# Patient Record
Sex: Male | Born: 1935 | Race: White | Hispanic: No | Marital: Married
Health system: Southern US, Community
[De-identification: ages and names within clinical notes are randomized; demographics above are authoritative.]

## PROBLEM LIST (undated history)

## (undated) DIAGNOSIS — R42 Dizziness and giddiness: Secondary | ICD-10-CM

## (undated) DIAGNOSIS — M109 Gout, unspecified: Secondary | ICD-10-CM

## (undated) DIAGNOSIS — I959 Hypotension, unspecified: Secondary | ICD-10-CM

## (undated) DIAGNOSIS — F0390 Unspecified dementia without behavioral disturbance: Secondary | ICD-10-CM

## (undated) DIAGNOSIS — E079 Disorder of thyroid, unspecified: Secondary | ICD-10-CM

## (undated) DIAGNOSIS — G629 Polyneuropathy, unspecified: Secondary | ICD-10-CM

## (undated) DIAGNOSIS — I2510 Atherosclerotic heart disease of native coronary artery without angina pectoris: Secondary | ICD-10-CM

## (undated) DIAGNOSIS — E119 Type 2 diabetes mellitus without complications: Secondary | ICD-10-CM

## (undated) DIAGNOSIS — R0600 Dyspnea, unspecified: Secondary | ICD-10-CM

## (undated) DIAGNOSIS — K219 Gastro-esophageal reflux disease without esophagitis: Secondary | ICD-10-CM

## (undated) DIAGNOSIS — N400 Enlarged prostate without lower urinary tract symptoms: Secondary | ICD-10-CM

---

## 2019-03-26 ENCOUNTER — Emergency Department (HOSPITAL_COMMUNITY): Payer: Medicare Other

## 2019-03-26 ENCOUNTER — Other Ambulatory Visit: Payer: Self-pay

## 2019-03-26 ENCOUNTER — Emergency Department (HOSPITAL_COMMUNITY)
Admission: EM | Admit: 2019-03-26 | Discharge: 2019-03-26 | Disposition: A | Discharge status: Home / Self Care | Payer: Medicare Other | Attending: Emergency Medicine | Admitting: Emergency Medicine

## 2019-03-26 ENCOUNTER — Encounter (HOSPITAL_COMMUNITY): Payer: Self-pay | Admitting: Emergency Medicine

## 2019-03-26 DIAGNOSIS — Z79899 Other long term (current) drug therapy: Secondary | ICD-10-CM

## 2019-03-26 DIAGNOSIS — M545 Low back pain: Secondary | ICD-10-CM | POA: Insufficient documentation

## 2019-03-26 DIAGNOSIS — Z8546 Personal history of malignant neoplasm of prostate: Secondary | ICD-10-CM | POA: Insufficient documentation

## 2019-03-26 DIAGNOSIS — I10 Essential (primary) hypertension: Secondary | ICD-10-CM | POA: Diagnosis not present

## 2019-03-26 DIAGNOSIS — W010XXA Fall on same level from slipping, tripping and stumbling without subsequent striking against object, initial encounter: Secondary | ICD-10-CM | POA: Diagnosis not present

## 2019-03-26 DIAGNOSIS — F0391 Unspecified dementia with behavioral disturbance: Secondary | ICD-10-CM | POA: Diagnosis not present

## 2019-03-26 DIAGNOSIS — E039 Hypothyroidism, unspecified: Secondary | ICD-10-CM | POA: Diagnosis not present

## 2019-03-26 DIAGNOSIS — R296 Repeated falls: Secondary | ICD-10-CM

## 2019-03-26 DIAGNOSIS — Z043 Encounter for examination and observation following other accident: Secondary | ICD-10-CM | POA: Diagnosis present

## 2019-03-26 LAB — CBC WITH DIFFERENTIAL/PLATELET
Abs Immature Granulocytes: 0.03 10*3/uL (ref 0.00–0.07)
Basophils Absolute: 0 10*3/uL (ref 0.0–0.1)
Basophils Relative: 1 %
Eosinophils Absolute: 0.1 10*3/uL (ref 0.0–0.5)
Eosinophils Relative: 1 %
HCT: 47.5 % (ref 39.0–52.0)
Hemoglobin: 16 g/dL (ref 13.0–17.0)
Immature Granulocytes: 1 %
Lymphocytes Relative: 18 %
Lymphs Abs: 1.1 10*3/uL (ref 0.7–4.0)
MCH: 33.9 pg (ref 26.0–34.0)
MCHC: 33.7 g/dL (ref 30.0–36.0)
MCV: 100.6 fL — ABNORMAL HIGH (ref 80.0–100.0)
Monocytes Absolute: 0.6 10*3/uL (ref 0.1–1.0)
Monocytes Relative: 10 %
Neutro Abs: 4.6 10*3/uL (ref 1.7–7.7)
Neutrophils Relative %: 69 %
Platelets: 111 10*3/uL — ABNORMAL LOW (ref 150–400)
RBC: 4.72 MIL/uL (ref 4.22–5.81)
RDW: 13.6 % (ref 11.5–15.5)
WBC: 6.5 10*3/uL (ref 4.0–10.5)
nRBC: 0 % (ref 0.0–0.2)

## 2019-03-26 LAB — BASIC METABOLIC PANEL
Anion gap: 12 (ref 5–15)
BUN: 13 mg/dL (ref 8–23)
CO2: 24 mmol/L (ref 22–32)
Calcium: 9 mg/dL (ref 8.9–10.3)
Chloride: 104 mmol/L (ref 98–111)
Creatinine, Ser: 1.23 mg/dL (ref 0.61–1.24)
GFR calc Af Amer: >60 mL/min (ref >60)
GFR calc non Af Amer: 54 mL/min — ABNORMAL LOW (ref >60)
Glucose, Bld: 112 mg/dL — ABNORMAL HIGH (ref 70–99)
Potassium: 3.8 mmol/L (ref 3.5–5.1)
Sodium: 140 mmol/L (ref 135–145)

## 2019-03-26 MED ORDER — LORAZEPAM 1 MG PO TABS
2.0000 mg | ORAL_TABLET | Freq: Once | ORAL | Status: AC
  Administered 2019-03-26: 2 mg via ORAL
  Filled 2019-03-26: qty 2

## 2019-03-26 NOTE — ED Provider Notes (Signed)
[facility] EMERGENCY DEPARTMENT Provider Note   CSN: [id number] Arrival date & time: 03/26/19  [place]    History   Chief Complaint No chief complaint on file.   HPI Nazim Kadlec is a 85 y.o. male.     HPI  Patient presents via EMS from [facility] greens for evaluation of fall.  According to staff and EMS, he had unwitnessed fall.  Found down.  History of dementia and is unable to give history.  No obvious traumatic injuries.  On my exam he denies pain.  I called the facility and spoke with staff member Blessing who reported that he has been there for 2 days.  He has been refusing meals and that when he was found down he was complaining of low back pain and that EMS had to help him move due to significant discomfort but no other symptoms or concerns.  I also called his daughter Benjamine Mola who said that 2 weeks ago he had a fall down 2 steps and was evaluated at that time and told he had a back sprain.  Reports that he does seem to be more medicated in a subdued way since arrival at [facility].  History reviewed. No pertinent past medical history.  There are no active problems to display for this patient.   History reviewed. No pertinent surgical history.      Home Medications    Prior to Admission medications   Not on File    Family History No family history on file.  Social History Social History   Tobacco Use   Smoking status: Not on file  Substance Use Topics   Alcohol use: Not on file   Drug use: Not on file     Allergies   Patient has no allergy information on record.   Review of Systems Review of Systems  Unable to perform ROS: Dementia     Physical Exam Updated Vital Signs BP (!) 146/89 (BP Location: Right Arm)    Pulse 71    Resp 15    Ht 6\' 2"  (1.88 m)    SpO2 99%   Physical Exam Vitals signs and nursing note reviewed.  Constitutional:      General: He is not in acute distress.    Appearance: He is  well-developed.  HENT:     Head: Normocephalic and atraumatic.     Mouth/Throat:     Mouth: Mucous membranes are moist.  Eyes:     Conjunctiva/sclera: Conjunctivae normal.  Neck:     Musculoskeletal: Neck supple.  Cardiovascular:     Rate and Rhythm: Normal rate. Rhythm irregular.     Heart sounds: No murmur.     Comments: Distant heart sounds Pulmonary:     Effort: Pulmonary effort is normal. No respiratory distress.     Breath sounds: Normal breath sounds.  Abdominal:     Palpations: Abdomen is soft.     Tenderness: There is no abdominal tenderness.  Musculoskeletal:        General: No tenderness.  Skin:    General: Skin is warm and dry.  Neurological:     Mental Status: He is alert.     Comments: Pleasant demented.  Oriented to person and place but not time.  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  Easily redirectable.    Psychiatric:     Comments: Calm, cooperative      ED Treatments / Results  Labs (all labs ordered are listed, but  only abnormal results are displayed) Labs Reviewed  CBC WITH DIFFERENTIAL/PLATELET - Abnormal; Notable for the following components:      Result Value   MCV 100.6 (*)    Platelets 111 (*)    All other components within normal limits  BASIC METABOLIC PANEL - Abnormal; Notable for the following components:   Glucose, Bld 112 (*)    GFR calc non Af Amer 54 (*)    All other components within normal limits    EKG None  Radiology Ct Head Wo Contrast  Result Date: 03/26/2019 CLINICAL DATA:  Pt had an unwitnessed fall at an unknown time. Pt denied LOC and neck pain. Pt complains of right lower back pain. Pt also had a fall 2 weeks ago. Pt does have dementia. EXAM: CT HEAD WITHOUT CONTRAST CT CERVICAL SPINE WITHOUT CONTRAST TECHNIQUE: Multidetector CT imaging of the head and cervical spine was performed following the standard protocol without intravenous contrast. Multiplanar CT image reconstructions of the cervical spine  were also generated. COMPARISON:  None. FINDINGS: CT HEAD FINDINGS Brain: No evidence of acute infarction, hemorrhage, hydrocephalus, extra-axial collection or mass lesion/mass effect. There is ventricular and sulcal enlargement reflecting moderate generalized atrophy. 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 There is also an old infarct of the left temporal lobe patchy white matter hypoattenuation is seen more diffusely consistent with moderate chronic microvascular ischemic change. Vascular: No hyperdense vessel or unexpected calcification. Skull: Normal. Negative for fracture or focal lesion. Sinuses/Orbits: Globes and orbits are unremarkable. Sinuses and mastoid air cells are clear. Other: None. CT CERVICAL SPINE FINDINGS Alignment: Neck is held hyper extended. There is no spondylolisthesis. Skull base and vertebrae: No acute fracture. No primary bone lesion or focal pathologic process. Soft tissues and spinal canal: No prevertebral fluid or swelling. No visible canal hematoma. Disc levels: Mild loss of disc height at C4-C5 with moderate loss of disc height at C5-C6 and C6-C7. There are facet degenerative changes bilaterally, greater on the right most severe at C3-C4 and C4-C5. No convincing disc herniation. Upper chest: No acute findings.  Lung apices are clear. Other: None. IMPRESSION: HEAD CT 1. No acute intracranial abnormalities. 2. Atrophy, old infarcts and chronic microvascular ischemic change as detailed. CERVICAL CT 1. No fracture or acute finding. Electronically Signed   By: Amie Portlandavid  Ormond M.D.   On: 03/26/2019 19:48   Ct Cervical Spine Wo Contrast  Result Date: 03/26/2019 CLINICAL DATA:  Pt had an unwitnessed fall at an unknown time. Pt denied LOC and neck pain. Pt complains of right lower back pain. Pt also had a fall 2 weeks ago. Pt does have dementia. EXAM: CT HEAD WITHOUT CONTRAST CT CERVICAL SPINE WITHOUT CONTRAST TECHNIQUE:  Multidetector CT imaging of the head and cervical spine was performed following the standard protocol without intravenous contrast. Multiplanar CT image reconstructions of the cervical spine were also generated. COMPARISON:  None. FINDINGS: CT HEAD FINDINGS Brain: No evidence of acute infarction, hemorrhage, hydrocephalus, extra-axial collection or mass lesion/mass effect. There is ventricular and sulcal enlargement reflecting moderate generalized atrophy. 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 There is also an old infarct of the left temporal lobe patchy white matter hypoattenuation is seen more diffusely consistent with moderate chronic microvascular ischemic change. Vascular: No hyperdense vessel or unexpected calcification. Skull: Normal. Negative for fracture or focal lesion. Sinuses/Orbits: Globes and orbits are unremarkable. Sinuses and mastoid air cells  are clear. Other: None. CT CERVICAL SPINE FINDINGS Alignment: Neck is held hyper extended. There is no spondylolisthesis. Skull base and vertebrae: No acute fracture. No primary bone lesion or focal pathologic process. Soft tissues and spinal canal: No prevertebral fluid or swelling. No visible canal hematoma. Disc levels: Mild loss of disc height at C4-C5 with moderate loss of disc height at C5-C6 and C6-C7. There are facet degenerative changes bilaterally, greater on the right most severe at C3-C4 and C4-C5. No convincing disc herniation. Upper chest: No acute findings.  Lung apices are clear. Other: None. IMPRESSION: HEAD CT 1. No acute intracranial abnormalities. 2. Atrophy, old infarcts and chronic microvascular ischemic change as detailed. CERVICAL CT 1. No fracture or acute finding. Electronically Signed   By: Amie Portlandavid  Ormond M.D.   On: 03/26/2019 19:48    Procedures Procedures (including critical care time)  Medications Ordered in ED Medications  LORazepam (ATIVAN) tablet 2 mg (2 mg  Oral Given 03/26/19 2020)     Initial Impression / Assessment and Plan / ED Course  I have reviewed the triage vital signs and the nursing notes.  Pertinent labs & imaging results that were available during my care of the patient were reviewed by me and considered in my medical decision making (see chart for details).        Mr. Kimberlee Nearingtheridge is an 85 year old gentleman with a history of dizziness, hypotension, hypertension, dyspnea, hyperglycemia, falls, hypothyroidism, GERD, gout, hyperlipidemia, abnormal gait, neuropathy, malignant neoplasm of prostate, dimension, and cardiovascular disorder.  Current medications include allopurinol, amlodipine, divalproex, lorazepam, memantine, quetiapine, sertraline.  We do not have records in our system.  This history is from review of facility records.  Presented today for unwitnessed fall.  He had no tenderness.  Per facility he is at baseline mentally.  Due to advanced age and unknown severity of magnesium proceed with CT head and cervical spine.  Patient's exam was reassuring but give history of complaint of pain by staff I also ordered hip and back xray.  However after returning from CT I was able to clear his collar as he had full range of motion and no midline tenderness and he was ambulating without any tenderness at all in his back or hips.  Discontinued plain films.  He became more anxious and difficult to redirect while awaiting CT results.  Gave 2 mg Ativan p.o. which is a normal evening medication.  He had good response and was appropriate for discharge.  Arranged for transfer back to facility and he was discharged in stable condition to follow-up as needed with PCP.  I did note on his discharge impressions diagnosis of high risk medication use due to regular lorazepam in geriatric patient which puts him at increased risk of falls and I would recommend he be weaned from this and placed on alternative if at all possible.  This patient was seen in  conjunction with Dr. Patria Maneampos.       Final Clinical Impressions(s) / ED Diagnoses   Final diagnoses:  Fall in elderly patient  Dementia with behavioral disturbance, unspecified dementia type (HCC)  High risk medication use    ED Discharge Orders    None       Jaclynn MajorStarnes, Jeanifer Halliday, MD 03/26/19 2051    Azalia Bilisampos, Kevin, MD 03/26/19 2139

## 2019-03-26 NOTE — ED Notes (Signed)
Patient back from CT.

## 2019-03-26 NOTE — ED Notes (Signed)
Patient transported to CT 

## 2019-03-26 NOTE — ED Notes (Signed)
Patient verbalizes understanding of discharge instructions. Opportunity for questioning and answers were provided. Armband removed by staff, pt discharged from ED via [facility] to home.  °

## 2019-03-26 NOTE — ED Triage Notes (Signed)
[facility]- pt is brought in from [facility]. Pt had an unwitnessed fall at an unknown time. Pt denied LOC and neck pain. Pt complains of right lower back pain. Pt also had a fall 2 weeks ago. Pt does have dementia.   118/54 79 HR 107 CBG

## 2019-03-26 NOTE — ED Notes (Signed)
Called ptar 

## 2019-04-10 ENCOUNTER — Emergency Department (HOSPITAL_COMMUNITY): Payer: Medicare Other

## 2019-04-10 ENCOUNTER — Encounter (HOSPITAL_COMMUNITY): Payer: Self-pay | Admitting: *Deleted

## 2019-04-10 ENCOUNTER — Emergency Department (HOSPITAL_COMMUNITY)
Admission: EM | Admit: 2019-04-10 | Discharge: 2019-04-10 | Disposition: A | Discharge status: Home / Self Care | Payer: Medicare Other | Attending: Emergency Medicine | Admitting: Emergency Medicine

## 2019-04-10 ENCOUNTER — Other Ambulatory Visit: Payer: Self-pay

## 2019-04-10 DIAGNOSIS — E119 Type 2 diabetes mellitus without complications: Secondary | ICD-10-CM | POA: Insufficient documentation

## 2019-04-10 DIAGNOSIS — Y92129 Unspecified place in nursing home as the place of occurrence of the external cause: Secondary | ICD-10-CM | POA: Insufficient documentation

## 2019-04-10 DIAGNOSIS — Y939 Activity, unspecified: Secondary | ICD-10-CM | POA: Insufficient documentation

## 2019-04-10 DIAGNOSIS — S0003XA Contusion of scalp, initial encounter: Secondary | ICD-10-CM | POA: Diagnosis not present

## 2019-04-10 DIAGNOSIS — S0181XA Laceration without foreign body of other part of head, initial encounter: Secondary | ICD-10-CM | POA: Diagnosis not present

## 2019-04-10 DIAGNOSIS — I259 Chronic ischemic heart disease, unspecified: Secondary | ICD-10-CM | POA: Diagnosis not present

## 2019-04-10 DIAGNOSIS — Y999 Unspecified external cause status: Secondary | ICD-10-CM | POA: Insufficient documentation

## 2019-04-10 DIAGNOSIS — S098XXA Other specified injuries of head, initial encounter: Secondary | ICD-10-CM | POA: Diagnosis not present

## 2019-04-10 DIAGNOSIS — Z23 Encounter for immunization: Secondary | ICD-10-CM | POA: Diagnosis not present

## 2019-04-10 DIAGNOSIS — Z79899 Other long term (current) drug therapy: Secondary | ICD-10-CM | POA: Diagnosis not present

## 2019-04-10 DIAGNOSIS — F0390 Unspecified dementia without behavioral disturbance: Secondary | ICD-10-CM | POA: Diagnosis not present

## 2019-04-10 DIAGNOSIS — W19XXXA Unspecified fall, initial encounter: Secondary | ICD-10-CM | POA: Insufficient documentation

## 2019-04-10 DIAGNOSIS — S0990XA Unspecified injury of head, initial encounter: Secondary | ICD-10-CM | POA: Diagnosis present

## 2019-04-10 HISTORY — DX: Benign prostatic hyperplasia without lower urinary tract symptoms: N40.0

## 2019-04-10 HISTORY — DX: Dizziness and giddiness: R42

## 2019-04-10 HISTORY — DX: Dyspnea, unspecified: R06.00

## 2019-04-10 HISTORY — DX: Gout, unspecified: M10.9

## 2019-04-10 HISTORY — DX: Unspecified dementia, unspecified severity, without behavioral disturbance, psychotic disturbance, mood disturbance, and anxiety: F03.90

## 2019-04-10 HISTORY — DX: Disorder of thyroid, unspecified: E07.9

## 2019-04-10 HISTORY — DX: Atherosclerotic heart disease of native coronary artery without angina pectoris: I25.10

## 2019-04-10 HISTORY — DX: Type 2 diabetes mellitus without complications: E11.9

## 2019-04-10 HISTORY — DX: Hypotension, unspecified: I95.9

## 2019-04-10 HISTORY — DX: Polyneuropathy, unspecified: G62.9

## 2019-04-10 HISTORY — DX: Gastro-esophageal reflux disease without esophagitis: K21.9

## 2019-04-10 LAB — CBC WITH DIFFERENTIAL/PLATELET
Abs Immature Granulocytes: 0.05 10*3/uL (ref 0.00–0.07)
Basophils Absolute: 0 10*3/uL (ref 0.0–0.1)
Basophils Relative: 1 %
Eosinophils Absolute: 0.1 10*3/uL (ref 0.0–0.5)
Eosinophils Relative: 1 %
HCT: 43.6 % (ref 39.0–52.0)
Hemoglobin: 14.3 g/dL (ref 13.0–17.0)
Immature Granulocytes: 1 %
Lymphocytes Relative: 10 %
Lymphs Abs: 0.8 10*3/uL (ref 0.7–4.0)
MCH: 33.6 pg (ref 26.0–34.0)
MCHC: 32.8 g/dL (ref 30.0–36.0)
MCV: 102.6 fL — ABNORMAL HIGH (ref 80.0–100.0)
Monocytes Absolute: 0.8 10*3/uL (ref 0.1–1.0)
Monocytes Relative: 10 %
Neutro Abs: 6.2 10*3/uL (ref 1.7–7.7)
Neutrophils Relative %: 77 %
Platelets: 123 10*3/uL — ABNORMAL LOW (ref 150–400)
RBC: 4.25 MIL/uL (ref 4.22–5.81)
RDW: 13.4 % (ref 11.5–15.5)
WBC: 7.9 10*3/uL (ref 4.0–10.5)
nRBC: 0 % (ref 0.0–0.2)

## 2019-04-10 LAB — COMPREHENSIVE METABOLIC PANEL
ALT: 18 U/L (ref 0–44)
AST: 24 U/L (ref 15–41)
Albumin: 3.4 g/dL — ABNORMAL LOW (ref 3.5–5.0)
Alkaline Phosphatase: 145 U/L — ABNORMAL HIGH (ref 38–126)
Anion gap: 12 (ref 5–15)
BUN: 14 mg/dL (ref 8–23)
CO2: 26 mmol/L (ref 22–32)
Calcium: 8.8 mg/dL — ABNORMAL LOW (ref 8.9–10.3)
Chloride: 101 mmol/L (ref 98–111)
Creatinine, Ser: 1.08 mg/dL (ref 0.61–1.24)
GFR calc Af Amer: >60 mL/min (ref >60)
GFR calc non Af Amer: >60 mL/min (ref >60)
Glucose, Bld: 133 mg/dL — ABNORMAL HIGH (ref 70–99)
Potassium: 5.1 mmol/L (ref 3.5–5.1)
Sodium: 139 mmol/L (ref 135–145)
Total Bilirubin: 0.8 mg/dL (ref 0.3–1.2)
Total Protein: 6.1 g/dL — ABNORMAL LOW (ref 6.5–8.1)

## 2019-04-10 MED ORDER — TETANUS-DIPHTH-ACELL PERTUSSIS 5-2.5-18.5 LF-MCG/0.5 IM SUSP
0.5000 mL | Freq: Once | INTRAMUSCULAR | Status: AC
  Administered 2019-04-10: 0.5 mL via INTRAMUSCULAR
  Filled 2019-04-10: qty 0.5

## 2019-04-10 NOTE — ED Notes (Addendum)
Condom cath placed on pt 

## 2019-04-10 NOTE — ED Triage Notes (Addendum)
EMS reports pt resides at [facility] fall with laceration to left head near eye, bleeding controlled, no other injuries noted. Pt is an alert pt with dementia. 138/84-86-18-98% RA 97.0 Cbg 121 Pt usually walks with walker but was not today. Pt was placed in c collar by EMS.

## 2019-04-10 NOTE — ED Notes (Signed)
Puncture and hematoma over left eye cleaned and dressing applied. Ice also applied

## 2019-04-10 NOTE — ED Notes (Addendum)
Wound cleaned with normal saline, and dried, bleeding controlled, superficial lacerations to left eyebrow noted, no deep lacerations. Large hematoma to left eyebrow and forehead area, brusing noted on right hip area. Patient complaining of lower back pain.

## 2019-04-10 NOTE — ED Provider Notes (Signed)
[facility] DEPT Provider Note   CSN: [id number] Arrival date & time: 04/10/19  1833    History   Chief Complaint Chief Complaint  Patient presents with   Fall   Laceration    HPI Kevin [name] is a 85 y.o. male.     HPI Patient arrives from nursing home after unwitnessed fall.  Sustained laceration to the left eyebrow.  Patient has history of dementia.  He is unable to contribute to history.  Level 5 caveat applies. Past Medical History:  Diagnosis Date   BPH (benign prostatic hyperplasia)    Coronary artery disease    Dementia (HCC)    Diabetes mellitus without complication (HCC)    Dizziness    Dyspnea    GERD (gastroesophageal reflux disease)    Gout    Hypotension    Neuropathy    Thyroid disease     There are no active problems to display for this patient.   History reviewed. No pertinent surgical history.      Home Medications    Prior to Admission medications   Medication Sig Start Date End Date Taking? Authorizing Provider  acetaminophen (TYLENOL) 500 MG tablet Take 500 mg by mouth every 4 (four) hours as needed for moderate pain.   Yes [provider]  allopurinol (ZYLOPRIM) 100 MG tablet Take 100 mg by mouth daily. 01/25/19  Yes [provider]  amLODipine (NORVASC) 2.5 MG tablet Take 2.5 mg by mouth daily. 03/25/19  Yes [provider]  divalproex (DEPAKOTE ER) 250 MG 24 hr tablet Take 750 mg by mouth at bedtime. 03/20/19  Yes [provider]  LORazepam (ATIVAN) 0.5 MG tablet Take 0.5 mg by mouth 4 (four) times daily. 03/29/19  Yes [provider]  magnesium oxide-pyridoxine (BEELITH) 362-20 MG TABS tablet Take 1 tablet by mouth daily.   Yes [provider]  memantine (NAMENDA) 10 MG tablet Take 10 mg by mouth 2 (two) times daily. 03/25/19  Yes [provider]  QUEtiapine (SEROQUEL) 50 MG tablet Take 50 mg by mouth at bedtime. 03/30/19  Yes  [provider]  sertraline (ZOLOFT) 25 MG tablet Take 25 mg by mouth daily. 03/10/19  Yes [provider]  vitamin B-12 (CYANOCOBALAMIN) 1000 MCG tablet Take 1,000 mcg by mouth daily.   Yes [provider]    Family History No family history on file.  Social History Social History   Tobacco Use   Smoking status: Unknown If Ever Smoked   Smokeless tobacco: Never Used  Substance Use Topics   Alcohol use: Not Currently   Drug use: Not Currently     Allergies   Patient has no known allergies.   Review of Systems Review of Systems  Unable to perform ROS: Dementia     Physical Exam Updated Vital Signs BP (!) 152/78    Pulse 75    Temp 97.8 F (36.6 C) (Oral)    Resp 18    SpO2 95%   Physical Exam Vitals signs and nursing note reviewed.  Constitutional:      Appearance: Normal appearance. He is well-developed.  HENT:     Head: Normocephalic.     Comments: Left eyebrow hematoma.  2 small laceration just superior to the lateral of the left eyebrow.  Appear to be mostly superficial.  No active bleeding.    Mouth/Throat:     Mouth: Mucous membranes are moist.  Eyes:     Pupils: Pupils are equal, round, and reactive  to light.  Neck:     Comments: Cervical collar in place. Cardiovascular:     Rate and Rhythm: Normal rate and regular rhythm.  Pulmonary:     Effort: Pulmonary effort is normal.     Breath sounds: Normal breath sounds.  Abdominal:     General: Bowel sounds are normal.     Palpations: Abdomen is soft.     Tenderness: There is no abdominal tenderness. There is no guarding or rebound.  Musculoskeletal: Normal range of motion.        General: No swelling, tenderness, deformity or signs of injury.     Right lower leg: No edema.     Left lower leg: No edema.     Comments: Pelvis is stable.  Skin:    General: Skin is warm and dry.     Findings: No erythema or rash.  Neurological:     Mental Status: He is alert.     Comments:  Oriented to self.  Intermittently following commands.  Psychiatric:        Behavior: Behavior normal.      ED Treatments / Results  Labs (all labs ordered are listed, but only abnormal results are displayed) Labs Reviewed  CBC WITH DIFFERENTIAL/PLATELET - Abnormal; Notable for the following components:      Result Value   MCV 102.6 (*)    Platelets 123 (*)    All other components within normal limits  COMPREHENSIVE METABOLIC PANEL - Abnormal; Notable for the following components:   Glucose, Bld 133 (*)    Calcium 8.8 (*)    Total Protein 6.1 (*)    Albumin 3.4 (*)    Alkaline Phosphatase 145 (*)    All other components within normal limits    EKG None  Radiology Ct Head Wo Contrast  Result Date: 04/10/2019 CLINICAL DATA:  Left frontal scalp laceration following a fall today. EXAM: CT HEAD WITHOUT CONTRAST CT CERVICAL SPINE WITHOUT CONTRAST TECHNIQUE: Multidetector CT imaging of the head and cervical spine was performed following the standard protocol without intravenous contrast. Multiplanar CT image reconstructions of the cervical spine were also generated. COMPARISON:  03/26/2019. FINDINGS: CT HEAD FINDINGS Brain: Stable moderately enlarged ventricles and subarachnoid spaces. Stable moderate patchy white matter low density in both cerebral hemispheres. Stable old right frontal, right parietal, left temporal and right occipital infarcts. No intracranial hemorrhage, mass lesion or CT evidence of acute infarction. Vascular: No hyperdense vessel or unexpected calcification. Skull: Normal. Negative for fracture or focal lesion. Sinuses/Orbits: Unremarkable. Other: Large left frontal scalp hematoma. CT CERVICAL SPINE FINDINGS Alignment: No significant change in mild anterolisthesis at the C3-4 level and mild retrolisthesis at the C5-6 level. Skull base and vertebrae: No acute fracture. No primary bone lesion or focal pathologic process. Soft tissues and spinal canal: No prevertebral fluid  or swelling. No visible canal hematoma. Disc levels:  Stable multilevel degenerative changes. Upper chest: Stable biapical pleural and parenchymal scarring. Other: Minimal left carotid artery calcification. IMPRESSION: 1. Large left frontal scalp hematoma without skull fracture or intracranial hemorrhage. 2. Stable atrophy, chronic small vessel white matter ischemic changes and old infarcts, as described above. 3. No cervical spine fracture or traumatic subluxation. 4. Stable multilevel cervical spine degenerative changes. 5. Minimal left carotid artery atheromatous calcification. Electronically Signed   By: Beckie SaltsSteven  Reid M.D.   On: 04/10/2019 19:42   Ct Cervical Spine Wo Contrast  Result Date: 04/10/2019 CLINICAL DATA:  Left frontal scalp laceration following a fall today. EXAM: CT HEAD  WITHOUT CONTRAST CT CERVICAL SPINE WITHOUT CONTRAST TECHNIQUE: Multidetector CT imaging of the head and cervical spine was performed following the standard protocol without intravenous contrast. Multiplanar CT image reconstructions of the cervical spine were also generated. COMPARISON:  03/26/2019. FINDINGS: CT HEAD FINDINGS Brain: Stable moderately enlarged ventricles and subarachnoid spaces. Stable moderate patchy white matter low density in both cerebral hemispheres. Stable old right frontal, right parietal, left temporal and right occipital infarcts. No intracranial hemorrhage, mass lesion or CT evidence of acute infarction. Vascular: No hyperdense vessel or unexpected calcification. Skull: Normal. Negative for fracture or focal lesion. Sinuses/Orbits: Unremarkable. Other: Large left frontal scalp hematoma. CT CERVICAL SPINE FINDINGS Alignment: No significant change in mild anterolisthesis at the C3-4 level and mild retrolisthesis at the C5-6 level. Skull base and vertebrae: No acute fracture. No primary bone lesion or focal pathologic process. Soft tissues and spinal canal: No prevertebral fluid or swelling. No visible canal  hematoma. Disc levels:  Stable multilevel degenerative changes. Upper chest: Stable biapical pleural and parenchymal scarring. Other: Minimal left carotid artery calcification. IMPRESSION: 1. Large left frontal scalp hematoma without skull fracture or intracranial hemorrhage. 2. Stable atrophy, chronic small vessel white matter ischemic changes and old infarcts, as described above. 3. No cervical spine fracture or traumatic subluxation. 4. Stable multilevel cervical spine degenerative changes. 5. Minimal left carotid artery atheromatous calcification. Electronically Signed   By: Beckie SaltsSteven  Reid M.D.   On: 04/10/2019 19:42    Procedures Procedures (including critical care time)  Medications Ordered in ED Medications  Tdap (BOOSTRIX) injection 0.5 mL (0.5 mLs Intramuscular Given 04/10/19 1941)     Initial Impression / Assessment and Plan / ED Course  I have reviewed the triage vital signs and the nursing notes.  Pertinent labs & imaging results that were available during my care of the patient were reviewed by me and considered in my medical decision making (see chart for details).        Lacerations appeared to be superficial and did not need closure.  Will put antibiotic ointment and place dressing.  CT head and cervical spine without acute findings other than left eyebrow hematoma.  Ice is placed on the hematoma.  Final Clinical Impressions(s) / ED Diagnoses   Final diagnoses:  Injury of head, initial encounter  Scalp hematoma, initial encounter  Facial laceration, initial encounter    ED Discharge Orders    None       Loren RacerYelverton, Rumeal Cullipher, MD 04/10/19 2109

## 2019-04-10 NOTE — ED Notes (Signed)
Antibiotic ointment applied to superficial lacerations as well as non-stick dressing, ice pack applied to hematoma per MD.

## 2019-04-10 NOTE — ED Notes (Signed)
Wife contact-please update when labs and CT scans result: Home [phone] Cell [phone]

## 2019-04-10 NOTE — ED Notes (Signed)
Called to update wife and daughter on pt status, informed them he was being transferred back to [facility]. [facility] in route and will transport to [facility]. Chastity at [facility] in [place] took report on pt.

## 2019-04-15 ENCOUNTER — Other Ambulatory Visit: Payer: Self-pay

## 2019-04-15 ENCOUNTER — Emergency Department (HOSPITAL_COMMUNITY): Payer: Medicare Other

## 2019-04-15 ENCOUNTER — Emergency Department (HOSPITAL_COMMUNITY)
Admission: EM | Admit: 2019-04-15 | Discharge: 2019-04-15 | Disposition: A | Discharge status: Home / Self Care | Payer: Medicare Other | Attending: Emergency Medicine | Admitting: Emergency Medicine

## 2019-04-15 DIAGNOSIS — I499 Cardiac arrhythmia, unspecified: Secondary | ICD-10-CM

## 2019-04-15 DIAGNOSIS — E119 Type 2 diabetes mellitus without complications: Secondary | ICD-10-CM | POA: Insufficient documentation

## 2019-04-15 DIAGNOSIS — R002 Palpitations: Secondary | ICD-10-CM | POA: Diagnosis present

## 2019-04-15 DIAGNOSIS — Z79899 Other long term (current) drug therapy: Secondary | ICD-10-CM | POA: Diagnosis not present

## 2019-04-15 DIAGNOSIS — F0390 Unspecified dementia without behavioral disturbance: Secondary | ICD-10-CM | POA: Insufficient documentation

## 2019-04-15 DIAGNOSIS — I2510 Atherosclerotic heart disease of native coronary artery without angina pectoris: Secondary | ICD-10-CM | POA: Diagnosis not present

## 2019-04-15 LAB — CBC WITH DIFFERENTIAL/PLATELET
Abs Immature Granulocytes: 0.03 10*3/uL (ref 0.00–0.07)
Basophils Absolute: 0 10*3/uL (ref 0.0–0.1)
Basophils Relative: 0 %
Eosinophils Absolute: 0.1 10*3/uL (ref 0.0–0.5)
Eosinophils Relative: 2 %
HCT: 42.6 % (ref 39.0–52.0)
Hemoglobin: 14.1 g/dL (ref 13.0–17.0)
Immature Granulocytes: 1 %
Lymphocytes Relative: 16 %
Lymphs Abs: 1 10*3/uL (ref 0.7–4.0)
MCH: 34 pg (ref 26.0–34.0)
MCHC: 33.1 g/dL (ref 30.0–36.0)
MCV: 102.7 fL — ABNORMAL HIGH (ref 80.0–100.0)
Monocytes Absolute: 0.5 10*3/uL (ref 0.1–1.0)
Monocytes Relative: 9 %
Neutro Abs: 4.4 10*3/uL (ref 1.7–7.7)
Neutrophils Relative %: 72 %
Platelets: 129 10*3/uL — ABNORMAL LOW (ref 150–400)
RBC: 4.15 MIL/uL — ABNORMAL LOW (ref 4.22–5.81)
RDW: 13.2 % (ref 11.5–15.5)
WBC: 6.1 10*3/uL (ref 4.0–10.5)
nRBC: 0 % (ref 0.0–0.2)

## 2019-04-15 LAB — TSH: TSH: 1.653 u[IU]/mL (ref 0.350–4.500)

## 2019-04-15 LAB — MAGNESIUM: Magnesium: 2.2 mg/dL (ref 1.7–2.4)

## 2019-04-15 LAB — BASIC METABOLIC PANEL
Anion gap: 10 (ref 5–15)
BUN: 12 mg/dL (ref 8–23)
CO2: 27 mmol/L (ref 22–32)
Calcium: 8.4 mg/dL — ABNORMAL LOW (ref 8.9–10.3)
Chloride: 104 mmol/L (ref 98–111)
Creatinine, Ser: 1.11 mg/dL (ref 0.61–1.24)
GFR calc Af Amer: >60 mL/min (ref >60)
GFR calc non Af Amer: >60 mL/min (ref >60)
Glucose, Bld: 128 mg/dL — ABNORMAL HIGH (ref 70–99)
Potassium: 3.9 mmol/L (ref 3.5–5.1)
Sodium: 141 mmol/L (ref 135–145)

## 2019-04-15 NOTE — ED Notes (Signed)
Patient verbalizes understanding of discharge instructions. Opportunity for questioning and answers were provided. Armband removed by staff, pt discharged from ED with [facility].  

## 2019-04-15 NOTE — ED Provider Notes (Signed)
[facility] Provider Note   CSN: [id number] Arrival date & time: 04/15/19  1415     History   Chief Complaint Chief Complaint  Patient presents with  . Palpitations    HPI Kevin [name] is a 85 y.o. male with past medical history significant for BPH, CAD, dementia, diabetes, hypotension, neuropathy presents to emergency department today via EMS from [facility] after being found to have irregular heart beat. EMS reports pt was being evaluated on rounds today and the MD checked his pulse and found it to be irregular. Pt denies any pain, shortness of breath however he is demented so a level 5 caveat applies.    Past Medical History:  Diagnosis Date  . BPH (benign prostatic hyperplasia)   . Coronary artery disease   . Dementia (HCC)   . Diabetes mellitus without complication (HCC)   . Dizziness   . Dyspnea   . GERD (gastroesophageal reflux disease)   . Gout   . Hypotension   . Neuropathy   . Thyroid disease     There are no active problems to display for this patient.   No past surgical history on file.      Home Medications    Prior to Admission medications   Medication Sig Start Date End Date Taking? Authorizing Provider  acetaminophen (TYLENOL) 500 MG tablet Take 500 mg by mouth every 4 (four) hours as needed for moderate pain.    [provider]  allopurinol (ZYLOPRIM) 100 MG tablet Take 100 mg by mouth daily. 01/25/19   [provider]  amLODipine (NORVASC) 2.5 MG tablet Take 2.5 mg by mouth daily. 03/25/19   [provider]  divalproex (DEPAKOTE ER) 250 MG 24 hr tablet Take 750 mg by mouth at bedtime. 03/20/19   [provider]  LORazepam (ATIVAN) 0.5 MG tablet Take 0.5 mg by mouth 4 (four) times daily. 03/29/19   [provider]  magnesium oxide-pyridoxine (BEELITH) 362-20 MG TABS tablet Take 1 tablet by mouth daily.    [provider]  memantine (NAMENDA) 10 MG  tablet Take 10 mg by mouth 2 (two) times daily. 03/25/19   [provider]  QUEtiapine (SEROQUEL) 50 MG tablet Take 50 mg by mouth at bedtime. 03/30/19   [provider]  sertraline (ZOLOFT) 25 MG tablet Take 25 mg by mouth daily. 03/10/19   [provider]  vitamin B-12 (CYANOCOBALAMIN) 1000 MCG tablet Take 1,000 mcg by mouth daily.    [provider]    Family History No family history on file.  Social History Social History   Tobacco Use  . Smoking status: Unknown If Ever Smoked  . Smokeless tobacco: Never Used  Substance Use Topics  . Alcohol use: Not Currently  . Drug use: Not Currently     Allergies   Patient has no known allergies.   Review of Systems Review of Systems  Unable to perform ROS: Dementia     Physical Exam Updated Vital Signs BP 137/66 (BP Location: Right Arm)   Pulse 80   Temp 97.7 F (36.5 C) (Oral)   Resp 16   SpO2 98%   Physical Exam Vitals signs and nursing note reviewed.  Constitutional:      General: He is not in acute distress.    Appearance: He is not ill-appearing.     Comments: Pt has healing hematoma to left side of face.   HENT:     Head: Normocephalic and atraumatic.  Right Ear: Tympanic membrane and external ear normal.     Left Ear: Tympanic membrane and external ear normal.     Nose: Nose normal.     Mouth/Throat:     Mouth: Mucous membranes are moist.     Pharynx: Oropharynx is clear.  Eyes:     General: No scleral icterus.       Right eye: No discharge.        Left eye: No discharge.     Extraocular Movements: Extraocular movements intact.     Conjunctiva/sclera: Conjunctivae normal.     Pupils: Pupils are equal, round, and reactive to light.  Neck:     Musculoskeletal: Normal range of motion.     Vascular: No JVD.  Cardiovascular:     Rate and Rhythm: Normal rate and regular rhythm.     Pulses: Normal pulses.          Radial pulses are 2+ on the right side and 2+ on the left  side.     Heart sounds: Normal heart sounds.  Pulmonary:     Comments: Lungs clear to auscultation in all fields. Symmetric chest rise. No wheezing, rales, or rhonchi. Chest:     Chest wall: No tenderness.  Abdominal:     Comments: Abdomen is soft, non-distended, and non-tender in all quadrants. No rigidity, no guarding. No peritoneal signs.  Musculoskeletal: Normal range of motion.  Skin:    General: Skin is warm and dry.     Capillary Refill: Capillary refill takes less than 2 seconds.  Neurological:     Mental Status: He is oriented to person, place, and time.     GCS: GCS eye subscore is 4. GCS verbal subscore is 5. GCS motor subscore is 6.     Comments: Fluent speech, no facial droop. Pt is pleasantly demented. He is alert and oriented to self only  Psychiatric:        Behavior: Behavior normal.      ED Treatments / Results  Labs (all labs ordered are listed, but only abnormal results are displayed) Labs Reviewed  CBC WITH DIFFERENTIAL/PLATELET - Abnormal; Notable for the following components:      Result Value   RBC 4.15 (*)    MCV 102.7 (*)    Platelets 129 (*)    All other components within normal limits  BASIC METABOLIC PANEL - Abnormal; Notable for the following components:   Glucose, Bld 128 (*)    Calcium 8.4 (*)    All other components within normal limits  MAGNESIUM  TSH    EKG EKG Interpretation  Date/Time:  Thursday April 15 2019 14:47:11 EDT Ventricular Rate:  89 PR Interval:    QRS Duration: 95 QT Interval:  408 QTC Calculation: 471 R Axis:   7 Text Interpretation:  Sinus rhythm Multiform ventricular premature complexes Prolonged PR interval Low voltage, extremity and precordial leads Consider anterior infarct Confirmed by Blane OharaZavitz, Joshua [phone]([id number]) on 04/15/2019 3:04:03 PM   Radiology No results found.  Procedures Procedures (including critical care time)  Medications Ordered in ED Medications - No data to display   Initial Impression /  Assessment and Plan / ED Course  I have reviewed the triage vital signs and the nursing notes.  Pertinent labs & imaging results that were available during my care of the patient were reviewed by me and considered in my medical decision making (see chart for details).  85 yo male presents after being found to have irregular hear rate. He  is pleasantly demented. He does not appear to be in respiratory distress. Pt palpated from head to toe without any tenderness. Chest wall is non tender to palpation. Lungs are clear to auscultation in all fields.  He is overall very well appearing. EKG without ischemic changes.    CBC, BMP, and magnesium overall unremarkable. TSH and chest xray pending. Findings and plan of care discussed with supervising physician Dr. Reather Converse who agrees with plan.   Patient care transferred to J. Ward PA-C at the end of my shift. Patient presentation, ED course, and plan of care discussed with review of all pertinent labs and imaging. Please see hernote for further details regarding further ED course and disposition. Anticipate discharge to facility with TSH and chest xray are without findings.  This note was prepared using Dragon voice recognition software and may include unintentional dictation errors due to the inherent limitations of voice recognition software.    Final Clinical Impressions(s) / ED Diagnoses   Final diagnoses:  None    ED Discharge Orders    None       Cherre Robins, PA-C 04/15/19 1604    Elnora Morrison, MD 04/15/19 1624

## 2019-04-15 NOTE — Discharge Instructions (Signed)
It was my pleasure taking care of you today!   Fortunately, your lab tests, EKG and chest x-ray today were reassuring.   I would like you to call the cardiology clinic to schedule a follow up appointment.   Return to ER for chest pain, difficulty breathing, new or worsening symptoms, any additional concerns.

## 2019-04-15 NOTE — ED Notes (Signed)
PTAR called  

## 2019-04-15 NOTE — ED Provider Notes (Signed)
Care assumed from previous provider PA Burnside. Please see note for further details. Case discussed, plan agreed upon. Will follow up on CXR and pending labs. If reassuring, anticipate d/c back to facility.   CXR without acute findings.  Labs reviewed and reassuring.   Discussed results with patient. Outpatient cardiology follow up encouraged. Patient discharged in satisfactory condition.    Ward, Ozella Almond, PA-C 04/15/19 1629    Fredia Sorrow, MD 04/24/19 [phone]

## 2019-04-24 ENCOUNTER — Emergency Department (HOSPITAL_COMMUNITY)
Admission: EM | Admit: 2019-04-24 | Discharge: 2019-04-24 | Disposition: A | Discharge status: Home / Self Care | Payer: Medicare Other | Attending: Emergency Medicine | Admitting: Emergency Medicine

## 2019-04-24 ENCOUNTER — Encounter (HOSPITAL_COMMUNITY): Payer: Self-pay | Admitting: Emergency Medicine

## 2019-04-24 DIAGNOSIS — I2510 Atherosclerotic heart disease of native coronary artery without angina pectoris: Secondary | ICD-10-CM | POA: Diagnosis not present

## 2019-04-24 DIAGNOSIS — F0390 Unspecified dementia without behavioral disturbance: Secondary | ICD-10-CM | POA: Insufficient documentation

## 2019-04-24 DIAGNOSIS — R296 Repeated falls: Secondary | ICD-10-CM | POA: Insufficient documentation

## 2019-04-24 DIAGNOSIS — Z79899 Other long term (current) drug therapy: Secondary | ICD-10-CM | POA: Diagnosis not present

## 2019-04-24 DIAGNOSIS — E119 Type 2 diabetes mellitus without complications: Secondary | ICD-10-CM | POA: Insufficient documentation

## 2019-04-24 DIAGNOSIS — W19XXXA Unspecified fall, initial encounter: Secondary | ICD-10-CM

## 2019-04-24 NOTE — Discharge Instructions (Addendum)
There were no injuries from the fall today.  Continue his usual treatment and care as previous.  Return here if needed for problems.

## 2019-04-24 NOTE — ED Provider Notes (Signed)
[facility]-EMERGENCY DEPT Provider Note   CSN: [id number] Arrival date & time: 04/24/19  1949     History   Chief Complaint Chief Complaint  Patient presents with  . Fall    HPI Kevin [name] is a 85 y.o. male.     HPI   Patient lives in a skilled nursing facility where he fell today.  He has had recurrent falls.  He presents with known prior injuries to his right hand and his left chin.  Today the patient fell on carpet and there are no reported injuries, by staff at the nursing facility.  He came by EMS.  He is unable to give any history.  Level 5 caveat-dementia    Past Medical History:  Diagnosis Date  . BPH (benign prostatic hyperplasia)   . Coronary artery disease   . Dementia (HCC)   . Diabetes mellitus without complication (HCC)   . Dizziness   . Dyspnea   . GERD (gastroesophageal reflux disease)   . Gout   . Hypotension   . Neuropathy   . Thyroid disease     There are no active problems to display for this patient.   History reviewed. No pertinent surgical history.      Home Medications    Prior to Admission medications   Medication Sig Start Date End Date Taking? Authorizing Provider  acetaminophen (TYLENOL) 500 MG tablet Take 500 mg by mouth every 4 (four) hours as needed for moderate pain.   Yes [provider]  allopurinol (ZYLOPRIM) 100 MG tablet Take 100 mg by mouth daily. 01/25/19  Yes [provider]  amLODipine (NORVASC) 2.5 MG tablet Take 2.5 mg by mouth daily. 03/25/19  Yes [provider]  LORazepam (ATIVAN) 2 MG tablet Take 2 mg by mouth 2 (two) times daily.   Yes [provider]  magnesium oxide-pyridoxine (BEELITH) 362-20 MG TABS tablet Take 1 tablet by mouth daily.   Yes [provider]  memantine (NAMENDA) 10 MG tablet Take 10 mg by mouth 2 (two) times daily. 03/25/19  Yes [provider]  Menthol-Methyl Salicylate (ICY HOT EXTRA STRENGTH) 10-30 % CREA  Apply 1 application topically every 6 (six) hours as needed (for Pain).   Yes [provider]  QUEtiapine (SEROQUEL) 25 MG tablet Take 25 mg by mouth 2 (two) times daily.   Yes [provider]  sertraline (ZOLOFT) 25 MG tablet Take 25 mg by mouth at bedtime.   Yes [provider]  vitamin B-12 (CYANOCOBALAMIN) 1000 MCG tablet Take 1,000 mcg by mouth daily.   Yes [provider]    Family History No family history on file.  Social History Social History   Tobacco Use  . Smoking status: Unknown If Ever Smoked  . Smokeless tobacco: Never Used  Substance Use Topics  . Alcohol use: Not Currently  . Drug use: Not Currently     Allergies   Patient has no known allergies.   Review of Systems Review of Systems  Unable to perform ROS: Dementia     Physical Exam Updated Vital Signs BP 111/84 (BP Location: Right Arm)   Pulse 96   Temp 97.6 F (36.4 C) (Oral)   Resp 16   SpO2 95%   Physical Exam Vitals signs and nursing note reviewed.  Constitutional:      General: He is not in acute distress.    Appearance: He is well-developed. He is not ill-appearing, toxic-appearing or diaphoretic.  HENT:  Head: Normocephalic and atraumatic.     Right Ear: External ear normal.     Left Ear: External ear normal.  Eyes:     Conjunctiva/sclera: Conjunctivae normal.     Pupils: Pupils are equal, round, and reactive to light.  Neck:     Musculoskeletal: Normal range of motion and neck supple.     Trachea: Phonation normal.  Cardiovascular:     Rate and Rhythm: Normal rate and regular rhythm.     Heart sounds: Normal heart sounds.  Pulmonary:     Effort: Pulmonary effort is normal.     Breath sounds: Normal breath sounds.  Chest:     Chest wall: No tenderness.  Abdominal:     Palpations: Abdomen is soft.     Tenderness: There is no abdominal tenderness.  Musculoskeletal: Normal range of motion.  Skin:    General: Skin is warm and dry.      Comments: Healing contusion, left chin.  Healing abrasion right dorsum of hand; no associated drainage, bleeding or deformity.  Neurological:     Mental Status: He is alert.     Cranial Nerves: No cranial nerve deficit.     Motor: No abnormal muscle tone.     Coordination: Coordination normal.  Psychiatric:        Mood and Affect: Mood normal.        Behavior: Behavior normal.      ED Treatments / Results  Labs (all labs ordered are listed, but only abnormal results are displayed) Labs Reviewed - No data to display  EKG None  Radiology No results found.  Procedures Procedures (including critical care time)  Medications Ordered in ED Medications - No data to display   Initial Impression / Assessment and Plan / ED Course  I have reviewed the triage vital signs and the nursing notes.  Pertinent labs & imaging results that were available during my care of the patient were reviewed by me and considered in my medical decision making (see chart for details).  Clinical Course as of Apr 23 2129  Sat Apr 24, 2019  2125 I was able to reach the patient's wife, and we discussed his findings.  The patient does not appear to have any acute illness, or injury.  His wife is turned about the recurrent falls.  She states that he did have an evaluation of his cardiac status following a recent ED visit.  There were no changes in his medications or treatment plan at that time.  I told her we will be sending him back, without further evaluation or treatment.  His wife was thankful and I answered all her questions.   [EW]    Clinical Course User Index [EW] Daleen Bo, MD        Patient Vitals for the past 24 hrs:  BP Temp Temp src Pulse Resp SpO2  04/24/19 2016 111/84 97.6 F (36.4 C) Oral 96 16 95 %  04/24/19 2008 - - Oral 92 - -  04/24/19 2001 - - - - - 95 %    9:28 PM Reevaluation with update and discussion. After initial assessment and treatment, an updated evaluation reveals  no change in clinical status.  Fine discussed with the patient's wife on the phone, and all questions were answered. Daleen Bo   Medical Decision Making: Falls, recurrent without injury today in the fall.  No visible musculoskeletal or skin injury to evaluate further.  Patient appears to be at his reported baseline, confused mental status.  No indication for further evaluation or treatment in ED setting.  CRITICAL CARE-no Performed by: Mancel BaleElliott Ifrah Vest   Nursing Notes Reviewed/ Care Coordinated Applicable Imaging Reviewed Interpretation of Laboratory Data incorporated into ED treatment  The patient appears reasonably screened and/or stabilized for discharge and I doubt any other medical condition or other [facility] requiring further screening, evaluation, or treatment in the ED at this time prior to discharge.  Plan: Home Medications-usual; Home Treatments-wound care, as usual; return here if the recommended treatment, does not improve the symptoms; Recommended follow up-PCP, as needed    Final Clinical Impressions(s) / ED Diagnoses   Final diagnoses:  Fall, initial encounter    ED Discharge Orders    None       Mancel BaleWentz, Vinicius Brockman, MD 04/24/19 2130

## 2019-04-24 NOTE — ED Triage Notes (Signed)
Pt comes to ed via ems, fall witnessed, 45 mins ago from [facility], hit his head on carpet surface area,no visible injuries. Pt does have fall  History and falls often with a hx of dementia at baseline.   Pt has past head contusion on left temporal head area. Skin tear on right hand that is covered and bleeding controlled. Pt has various generalized healing bruises.  V/s 136/78, pluse 66, spo2 96 room air , cbg 159, 97.9 temp.  Self combative. Does follow commands.

## 2019-06-15 ENCOUNTER — Encounter (HOSPITAL_COMMUNITY): Payer: Self-pay | Admitting: Behavioral Health

## 2019-06-15 ENCOUNTER — Emergency Department (HOSPITAL_COMMUNITY): Payer: Medicare Other

## 2019-06-15 ENCOUNTER — Other Ambulatory Visit: Payer: Self-pay

## 2019-06-15 ENCOUNTER — Emergency Department (HOSPITAL_COMMUNITY)
Admission: EM | Admit: 2019-06-15 | Discharge: 2019-07-01 | Disposition: A | Discharge status: Other Acute Inpatient Hospital | Payer: Medicare Other | Attending: Emergency Medicine | Admitting: Emergency Medicine

## 2019-06-15 DIAGNOSIS — Z20828 Contact with and (suspected) exposure to other viral communicable diseases: Secondary | ICD-10-CM | POA: Insufficient documentation

## 2019-06-15 DIAGNOSIS — R911 Solitary pulmonary nodule: Secondary | ICD-10-CM | POA: Insufficient documentation

## 2019-06-15 DIAGNOSIS — G309 Alzheimer's disease, unspecified: Secondary | ICD-10-CM | POA: Insufficient documentation

## 2019-06-15 DIAGNOSIS — I259 Chronic ischemic heart disease, unspecified: Secondary | ICD-10-CM | POA: Insufficient documentation

## 2019-06-15 DIAGNOSIS — E119 Type 2 diabetes mellitus without complications: Secondary | ICD-10-CM | POA: Diagnosis not present

## 2019-06-15 DIAGNOSIS — R4182 Altered mental status, unspecified: Secondary | ICD-10-CM | POA: Insufficient documentation

## 2019-06-15 DIAGNOSIS — Z046 Encounter for general psychiatric examination, requested by authority: Secondary | ICD-10-CM | POA: Diagnosis present

## 2019-06-15 DIAGNOSIS — Z751 Person awaiting admission to adequate facility elsewhere: Secondary | ICD-10-CM

## 2019-06-15 DIAGNOSIS — F03918 Unspecified dementia, unspecified severity, with other behavioral disturbance: Secondary | ICD-10-CM

## 2019-06-15 DIAGNOSIS — F0391 Unspecified dementia with behavioral disturbance: Secondary | ICD-10-CM

## 2019-06-15 DIAGNOSIS — Z79899 Other long term (current) drug therapy: Secondary | ICD-10-CM | POA: Diagnosis not present

## 2019-06-15 DIAGNOSIS — F0281 Dementia in other diseases classified elsewhere with behavioral disturbance: Secondary | ICD-10-CM | POA: Diagnosis not present

## 2019-06-15 LAB — COMPREHENSIVE METABOLIC PANEL
ALT: 17 U/L (ref 0–44)
AST: 20 U/L (ref 15–41)
Albumin: 3.7 g/dL (ref 3.5–5.0)
Alkaline Phosphatase: 110 U/L (ref 38–126)
Anion gap: 9 (ref 5–15)
BUN: 21 mg/dL (ref 8–23)
CO2: 27 mmol/L (ref 22–32)
Calcium: 8.6 mg/dL — ABNORMAL LOW (ref 8.9–10.3)
Chloride: 105 mmol/L (ref 98–111)
Creatinine, Ser: 0.92 mg/dL (ref 0.61–1.24)
GFR calc Af Amer: >60 mL/min (ref >60)
GFR calc non Af Amer: >60 mL/min (ref >60)
Glucose, Bld: 106 mg/dL — ABNORMAL HIGH (ref 70–99)
Potassium: 4.1 mmol/L (ref 3.5–5.1)
Sodium: 141 mmol/L (ref 135–145)
Total Bilirubin: 0.9 mg/dL (ref 0.3–1.2)
Total Protein: 6.2 g/dL — ABNORMAL LOW (ref 6.5–8.1)

## 2019-06-15 LAB — CBC
HCT: 40.8 % (ref 39.0–52.0)
Hemoglobin: 13.5 g/dL (ref 13.0–17.0)
MCH: 34.3 pg — ABNORMAL HIGH (ref 26.0–34.0)
MCHC: 33.1 g/dL (ref 30.0–36.0)
MCV: 103.6 fL — ABNORMAL HIGH (ref 80.0–100.0)
Platelets: 111 10*3/uL — ABNORMAL LOW (ref 150–400)
RBC: 3.94 MIL/uL — ABNORMAL LOW (ref 4.22–5.81)
RDW: 13.6 % (ref 11.5–15.5)
WBC: 8.5 10*3/uL (ref 4.0–10.5)
nRBC: 0 % (ref 0.0–0.2)

## 2019-06-15 LAB — RAPID URINE DRUG SCREEN, HOSP PERFORMED
Amphetamines: NOT DETECTED
Barbiturates: NOT DETECTED
Benzodiazepines: POSITIVE — AB
Cocaine: NOT DETECTED
Opiates: NOT DETECTED
Tetrahydrocannabinol: NOT DETECTED

## 2019-06-15 LAB — URINALYSIS, ROUTINE W REFLEX MICROSCOPIC
Bilirubin Urine: NEGATIVE
Glucose, UA: NEGATIVE mg/dL
Hgb urine dipstick: NEGATIVE
Ketones, ur: NEGATIVE mg/dL
Leukocytes,Ua: NEGATIVE
Nitrite: NEGATIVE
Protein, ur: NEGATIVE mg/dL
Specific Gravity, Urine: 1.019 (ref 1.005–1.030)
pH: 6 (ref 5.0–8.0)

## 2019-06-15 LAB — VALPROIC ACID LEVEL: Valproic Acid Lvl: <10 ug/mL — ABNORMAL LOW (ref 50.0–100.0)

## 2019-06-15 LAB — ETHANOL: Alcohol, Ethyl (B): <10 mg/dL (ref <10)

## 2019-06-15 MED ORDER — LORAZEPAM 0.5 MG PO TABS: 0.5000 mg | ORAL_TABLET | ORAL | Status: DC

## 2019-06-15 MED ORDER — LORAZEPAM 0.5 MG PO TABS: 0.5000 mg | ORAL_TABLET | Freq: Two times a day (BID) | ORAL | Status: DC

## 2019-06-15 MED ORDER — LORAZEPAM 1 MG PO TABS: 2.0000 mg | ORAL_TABLET | Freq: Two times a day (BID) | ORAL | Status: DC

## 2019-06-15 MED ORDER — ACETAMINOPHEN 500 MG PO TABS
500.0000 mg | ORAL_TABLET | ORAL | Status: DC | PRN
  Administered 2019-06-24: 500 mg via ORAL
  Filled 2019-06-15 (×3): qty 1

## 2019-06-15 MED ORDER — SERTRALINE HCL 50 MG PO TABS: 50.0000 mg | ORAL_TABLET | Freq: Every day | ORAL | Status: DC

## 2019-06-15 MED ORDER — HALOPERIDOL LACTATE 5 MG/ML IJ SOLN
5.0000 mg | Freq: Once | INTRAMUSCULAR | Status: AC
  Administered 2019-06-15: 5 mg via INTRAMUSCULAR
  Filled 2019-06-15: qty 1

## 2019-06-15 MED ORDER — SERTRALINE HCL 50 MG PO TABS
50.0000 mg | ORAL_TABLET | Freq: Every day | ORAL | Status: DC
  Administered 2019-06-15 – 2019-06-30 (×11): 50 mg via ORAL
  Filled 2019-06-15 (×14): qty 1

## 2019-06-15 MED ORDER — QUETIAPINE FUMARATE 25 MG PO TABS
25.0000 mg | ORAL_TABLET | Freq: Two times a day (BID) | ORAL | Status: DC
  Administered 2019-06-15 – 2019-06-18 (×6): 25 mg via ORAL
  Filled 2019-06-15 (×6): qty 1

## 2019-06-15 MED ORDER — SERTRALINE HCL 50 MG PO TABS: 25.0000 mg | ORAL_TABLET | Freq: Every day | ORAL | Status: DC

## 2019-06-15 MED ORDER — OLANZAPINE 5 MG PO TABS
5.0000 mg | ORAL_TABLET | Freq: Every day | ORAL | Status: DC
  Administered 2019-06-15 – 2019-06-18 (×3): 5 mg via ORAL
  Filled 2019-06-15 (×4): qty 1

## 2019-06-15 MED ORDER — MEMANTINE HCL 10 MG PO TABS
10.0000 mg | ORAL_TABLET | Freq: Two times a day (BID) | ORAL | Status: DC
  Administered 2019-06-15: 10 mg via ORAL
  Filled 2019-06-15 (×2): qty 1

## 2019-06-15 MED ORDER — TRAZODONE HCL 100 MG PO TABS
100.0000 mg | ORAL_TABLET | Freq: Every evening | ORAL | Status: DC | PRN
  Administered 2019-06-15 – 2019-07-01 (×11): 100 mg via ORAL
  Filled 2019-06-15 (×16): qty 1

## 2019-06-15 MED ORDER — LORAZEPAM 0.5 MG PO TABS
0.5000 mg | ORAL_TABLET | ORAL | Status: DC
  Administered 2019-06-15 – 2019-06-18 (×6): 0.5 mg via ORAL
  Filled 2019-06-15 (×7): qty 1

## 2019-06-15 MED ORDER — LORAZEPAM 1 MG PO TABS
1.0000 mg | ORAL_TABLET | Freq: Two times a day (BID) | ORAL | Status: DC | PRN
  Administered 2019-06-15 – 2019-06-18 (×4): 1 mg via ORAL
  Filled 2019-06-15 (×5): qty 1

## 2019-06-15 MED ORDER — HALOPERIDOL LACTATE 5 MG/ML IJ SOLN
5.0000 mg | Freq: Once | INTRAMUSCULAR | Status: AC
  Administered 2019-06-15: 19:00:00 5 mg via INTRAMUSCULAR
  Filled 2019-06-15: qty 1

## 2019-06-15 MED ORDER — AMLODIPINE BESYLATE 5 MG PO TABS
2.5000 mg | ORAL_TABLET | Freq: Every day | ORAL | Status: DC
  Administered 2019-06-15 – 2019-06-24 (×6): 2.5 mg via ORAL
  Administered 2019-06-26: 11:00:00 via ORAL
  Administered 2019-06-27 – 2019-06-29 (×3): 2.5 mg via ORAL
  Filled 2019-06-15 (×14): qty 1
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
## 2019-06-15 NOTE — ED Notes (Signed)
Pt dressed out into a hospital gown and belongings placed in a belongings bag and secured in the 9-12 cabinets. Pt's blue walker was labeled with a pt sticker and placed near the cabinet.

## 2019-06-15 NOTE — BH Assessment (Addendum)
10/20- Per Hampton Abbot, MD, this voluntary patient requires psychiatric hospitalization at a facility providing specialty geriatric care. Referred patient to the following facilities for placement. Patient under review:   [facility] (declined due to patient's acuity/aggressive) [facility]  [facility]  [facility]  [facility]

## 2019-06-15 NOTE — TOC Initial Note (Signed)
Transition of Care [facility]) - Initial/Assessment Note    Patient Details  Name: Kevin [name] MRN: [id number] Date of Birth: 1933-03-31  [facility]) CM/SW Contact:    Janace Hoard, LCSW Phone Number: 06/15/2019, 8:26 AM  Clinical Narrative:                 CSW spoke with patient's daughter Kevin [name] about patient's current needs. Kevin [name] reports patient is in the memory care unit at [facility]. Kevin [name] reports she has been in communication with [employer] and reports patient has been increasingly irritable and combative the past few weeks. Kevin [name] reports she and [employer] have been trying to get patient into geripsych to get his medication regulated to decrease the behaviors. Kevin [name] reports she hopes patient is able to get his medication regulated and return back to [facility]. She reports patient's wife lives in [place], [place] and cannot care for patient. CSW explained that patient will be evaluated by the psych team (TTS) for recommendations for geripsych. CSW will also make contact with Arlina Robes to find out if patient can return once a disposition is made by TTS. CSW will continue to follow up.   Expected Discharge Plan: Return to Memory Care at [facility]     Patient Goals and CMS Choice Patient states their goals for this hospitalization and ongoing recovery are:: Per patient's daughter "to get his medication regulated and go back to [facility]"      Expected Discharge Plan and Services Expected Discharge Plan: Memory Care       Living arrangements for the past 2 months: [place]                                      Prior Living Arrangements/Services Living arrangements for the past 2 months: [place] Lives with:: Self Patient language and need for interpreter reviewed:: Yes Do you feel safe going back to the place where you live?: Yes      Need for Family  Participation in Patient Care: Yes (Comment) Care giver support system in place?: Yes (comment)      Activities of Daily Living      Permission Sought/Granted Permission sought to share information with : Family Supports Permission granted to share information with : Yes, Verbal Permission Granted  Share Information with NAME: Verta Ellen     Permission granted to share info w Relationship: daughter  Permission granted to share info w Contact Information: [phone]  Emotional Assessment Appearance:: (Unable to assess) Attitude/Demeanor/Rapport: Unable to Assess Affect (typically observed): Unable to Assess        Admission diagnosis:  IVC There are no active problems to display for this patient.  PCP:  [facility] Pharmacy:  No Pharmacies Listed    Social Determinants of Health (SDOH) Interventions    Readmission Risk Interventions No flowsheet data found.

## 2019-06-15 NOTE — ED Notes (Signed)
TSS at bedside 
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
## 2019-06-15 NOTE — Progress Notes (Signed)
Faxed received from [facility] [employer], pt is no longer with this practice. Will need a PCP at time of dc. [place], [place] ED TOC CM [phone]

## 2019-06-15 NOTE — ED Notes (Addendum)
Patient attempted to climb out of bed X3. Patient redirected and put back into bed. T.V turned on for distraction and lights turned down. Patient encouraged to take a nap.   Patient refusing blood work or medication at this time.

## 2019-06-15 NOTE — ED Notes (Signed)
Pfeiffer, MD at bedside.  
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
## 2019-06-15 NOTE — ED Notes (Addendum)
Patient asleep. Urinal at bedside.
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
## 2019-06-15 NOTE — BH Assessment (Signed)
[facility] Assessment Progress Note  Per Hampton Abbot, MD, this voluntary patient requires psychiatric hospitalization at a facility providing specialty geriatric care.  Placement will be sought for pt.  Jalene Mullet, [place] Coordinator [phone]

## 2019-06-16 DIAGNOSIS — G309 Alzheimer's disease, unspecified: Secondary | ICD-10-CM | POA: Diagnosis not present

## 2019-06-16 MED ORDER — ZIPRASIDONE MESYLATE 20 MG IM SOLR
20.0000 mg | Freq: Once | INTRAMUSCULAR | Status: AC
  Administered 2019-06-16: 20 mg via INTRAMUSCULAR
  Filled 2019-06-16: qty 20

## 2019-06-16 MED ORDER — STERILE WATER FOR INJECTION IJ SOLN
INTRAMUSCULAR | Status: AC
  Administered 2019-06-16: 1.3 mL
  Filled 2019-06-16: qty 10

## 2019-06-16 MED ORDER — ALLOPURINOL 100 MG PO TABS
100.0000 mg | ORAL_TABLET | Freq: Every day | ORAL | Status: DC
  Administered 2019-06-16 – 2019-06-30 (×10): 100 mg via ORAL
  Filled 2019-06-16 (×16): qty 1

## 2019-06-16 MED ORDER — MAGNESIUM OXIDE 400 (241.3 MG) MG PO TABS
400.0000 mg | ORAL_TABLET | Freq: Every day | ORAL | Status: DC
  Administered 2019-06-16 – 2019-06-30 (×10): 400 mg via ORAL
  Filled 2019-06-16 (×16): qty 1

## 2019-06-16 MED ORDER — PYRIDOXINE HCL 25 MG PO TABS
25.0000 mg | ORAL_TABLET | Freq: Every day | ORAL | Status: DC
  Administered 2019-06-16 – 2019-06-30 (×10): 25 mg via ORAL
  Filled 2019-06-16 (×16): qty 1

## 2019-06-16 MED ORDER — ENSURE ENLIVE PO LIQD
237.0000 mL | Freq: Three times a day (TID) | ORAL | Status: DC
  Administered 2019-06-17 – 2019-06-30 (×19): 237 mL via ORAL
  Filled 2019-06-16 (×48): qty 237

## 2019-06-16 MED ORDER — VITAMIN B-12 1000 MCG PO TABS
1000.0000 ug | ORAL_TABLET | Freq: Every day | ORAL | Status: DC
  Administered 2019-06-16 – 2019-06-30 (×10): 1000 ug via ORAL
  Filled 2019-06-16 (×16): qty 1

## 2019-06-16 MED ORDER — MAGNESIUM OXIDE-PYRIDOXINE HCL 362-20 MG PO TABS: 1.0000 | ORAL_TABLET | Freq: Every day | ORAL | Status: DC

## 2019-06-16 MED ORDER — DIVALPROEX SODIUM 125 MG PO DR TAB
125.0000 mg | DELAYED_RELEASE_TABLET | Freq: Two times a day (BID) | ORAL | Status: DC
  Administered 2019-06-16 – 2019-07-01 (×22): 125 mg via ORAL
  Filled 2019-06-16 (×34): qty 1

## 2019-06-16 NOTE — ED Provider Notes (Signed)
Vitals:   06/16/19 0023 06/16/19 0703  BP: 125/75 (!) 104/53  Pulse: 75 65  Resp: 16 18  Temp:    SpO2: 91% 90%   Patient is awaiting psychiatric placement.  Vitals are stable this morning.  Continue to monitor.   Dorie Rank, MD 06/16/19 [phone]
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
## 2019-06-16 NOTE — ED Provider Notes (Signed)
Patient got very agitated this evening not able to redirect him.  Patient little unsteady in his feet does not want to stay in the bed.  Cussing and getting little aggressive.  So patient will be given Geodon.  This despite all the psychiatric meds that the patient is currently receiving.  Patient is awaiting placement.   Fredia Sorrow, MD 06/16/19 2053
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
## 2019-06-16 NOTE — BH Assessment (Addendum)
Per Earleen Newport, NP this patient continues to meet psychiatric hospitalizationat this time. Counselor re-faxed referral with updated clinicals/nurse's notes/labs to the following hospitals for consideration. Pending review:  [facility] (declined due to patient's acuity/aggressive) [facility]-(Adult unit full) [facility]  [facility]

## 2019-06-16 NOTE — ED Notes (Signed)
Pt confused. Pt needs redirection and reoriented . Pt needs assist to walk. Pt up with stand by assist and walker. Pt  Cooperative. Irritable at times, easily redirectable.

## 2019-06-17 DIAGNOSIS — G309 Alzheimer's disease, unspecified: Secondary | ICD-10-CM | POA: Diagnosis not present

## 2019-06-17 LAB — SARS CORONAVIRUS 2 BY RT PCR (HOSPITAL ORDER, PERFORMED IN ~~LOC~~ HOSPITAL LAB): SARS Coronavirus 2: NEGATIVE

## 2019-06-17 MED ORDER — STERILE WATER FOR INJECTION IJ SOLN
INTRAMUSCULAR | Status: AC
  Administered 2019-06-17: 09:00:00
  Filled 2019-06-17: qty 10

## 2019-06-17 MED ORDER — LORAZEPAM 2 MG/ML IJ SOLN
2.0000 mg | Freq: Once | INTRAMUSCULAR | Status: AC
  Administered 2019-06-17: 2 mg via INTRAMUSCULAR
  Filled 2019-06-17: qty 1

## 2019-06-17 MED ORDER — STERILE WATER FOR INJECTION IJ SOLN
INTRAMUSCULAR | Status: AC
  Administered 2019-06-17: 22:00:00
  Filled 2019-06-17: qty 10

## 2019-06-17 MED ORDER — DIPHENHYDRAMINE HCL 50 MG/ML IJ SOLN
50.0000 mg | Freq: Once | INTRAMUSCULAR | Status: DC
  Filled 2019-06-17: qty 1

## 2019-06-17 MED ORDER — ZIPRASIDONE MESYLATE 20 MG IM SOLR
10.0000 mg | Freq: Once | INTRAMUSCULAR | Status: AC
  Administered 2019-06-17: 10 mg via INTRAMUSCULAR
  Filled 2019-06-17: qty 20

## 2019-06-17 MED ORDER — DIPHENHYDRAMINE HCL 50 MG/ML IJ SOLN
INTRAMUSCULAR | Status: AC
  Filled 2019-06-17: qty 1

## 2019-06-17 MED ORDER — DIPHENHYDRAMINE HCL 50 MG/ML IJ SOLN
25.0000 mg | Freq: Once | INTRAMUSCULAR | Status: AC
  Administered 2019-06-17: 25 mg via INTRAMUSCULAR

## 2019-06-17 MED ORDER — DIPHENHYDRAMINE HCL 50 MG/ML IJ SOLN
INTRAMUSCULAR | Status: AC
  Administered 2019-06-17: 22:00:00
  Filled 2019-06-17: qty 1

## 2019-06-17 NOTE — ED Notes (Signed)
Awake now and two techs assisted him to the bathroom where he voided. He was given his HS meds at this time since he is awake, and day shift stated he slept very little today or last night. Gave him his Trazodone prn for sleep as well as his scheduled meds. At this time he is pleasantly confused. Talking about things that dont make sense to this staff person, about things in his house possibly. Drank a full cup of ginger ale, refused food, but gave him several graham crackers.

## 2019-06-17 NOTE — ED Notes (Signed)
Walked around unit a few minutes with his walker and his tech next to him. He continues to be confused and talk about going downstairs. He has been redirectable by staff. Returned him to bed after being up in  geri chair in hall and walking for about 15 min. He has had his sleeping pill along with his HS meds, expecting him to be sleepy.
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
## 2019-06-17 NOTE — BHH Counselor (Signed)
Psychiatry in the day has had difficulty assessing patient because he becomes agitated and then has to be sedated. TTS will discuss with night shift and request PM provider evaluate patient.
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
## 2019-06-17 NOTE — ED Notes (Signed)
Pt confused. Pt thinks he can go home. Pt difficult to redirect.

## 2019-06-17 NOTE — ED Notes (Signed)
Sleeping, snoring softly, breathing regular.
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
## 2019-06-17 NOTE — ED Notes (Signed)
Becoming more agitated and irritable. Demanding to get up, for me to break the chains," I dont want to spend all my life in bed." Tech at bedside trying to distract and calm him. Writer gave him prn Ativan 1 mg as ordered for agitation and to help him sleep. Trazodone hasnt helped to this point with sleep.

## 2019-06-17 NOTE — ED Notes (Signed)
Pt resting comfortably, no s/s of distress. Sitter at bedside.
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
## 2019-06-17 NOTE — Consult Note (Signed)
  Unable to reassess patient this morning related to agitation and aggressive behavior; patient had to be medicated and is currently sleeping.  Recommendation/Disposition:  Continue to seek placement at [facility] psychiatric facility.

## 2019-06-17 NOTE — ED Notes (Signed)
Has been asleep now for about 25 min. Quiet, breathing unlabored and regular. Medications effective.

## 2019-06-17 NOTE — ED Notes (Addendum)
Pt became agitated, uncooperative, would not let staff help, pt yelling, trying to swing, and throw walker.  PRN  Geodon 10 mg IM given  PRN Ativan 2 mg IM given Medication effective.

## 2019-06-17 NOTE — ED Notes (Signed)
Mr Mandigo  Is alert and restless attempting to get OOB . Currently yelling out family members names "Jacqlyn Larsen" , "Benjamine Mola". Attempting to reorient to realty has been unsuccessful and pt is refusing to take po PRN medications Ativan or Trazodone. Will consult with EDP for suggestions.

## 2019-06-18 ENCOUNTER — Emergency Department (HOSPITAL_COMMUNITY): Payer: Medicare Other

## 2019-06-18 DIAGNOSIS — G309 Alzheimer's disease, unspecified: Secondary | ICD-10-CM | POA: Diagnosis not present

## 2019-06-18 MED ORDER — SODIUM CHLORIDE (PF) 0.9 % IJ SOLN
INTRAMUSCULAR | Status: AC
  Filled 2019-06-18: qty 50

## 2019-06-18 MED ORDER — IOHEXOL 300 MG/ML  SOLN
75.0000 mL | Freq: Once | INTRAMUSCULAR | Status: AC | PRN
  Administered 2019-06-18: 75 mL via INTRAVENOUS
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
## 2019-06-18 NOTE — ED Provider Notes (Addendum)
Vitals:   06/17/19 2109 06/18/19 0620  BP: 131/60 114/68  Pulse: 87 79  Resp: 20 18  Temp: 97.7 F (36.5 C)   SpO2: 97% 96%   Pt is calm this morning.  Medically cleared.  Awaiting psychiatric placement.   Dorie Rank, MD 06/18/19 [phone]  CXR done earlier showed possible mass.  CT scan of chest recommended.   Central regional requests CT scan in the ED.  CT ordered   Dorie Rank, MD 06/18/19 [phone]

## 2019-06-18 NOTE — ED Notes (Signed)
Patient attempting to get out of the recliner, assisted to the RR with walker, new brief applied. Patient assisted back to the bed, resting quietly, vitals updated, warm blanket given.

## 2019-06-18 NOTE — Progress Notes (Signed)
CSW received a call from pt's daughter Arnoldo Morale who is asking to bring the pt a small suitcase or an appropriate amount of clothing to take with with him when or if the pt is placed psychiatrically.  CSW stated a very small suitcase may be appropriate, but that she could call the ED TCU RN for specifics rules and provided pt's daughter with the number.  Pt's daughter was appreciative and thanked the CSW.  CSW called ED TCU and asked the MHT/NT to please advise the RN, who was in with a pt, that pt's (rm [phone]) daughter was about to call regarding the pt's needs/clothing and that the pt's daughter has already been in contact/communication with staff and was appropriate, per the chart, to discuss pt's needs if necessary.  CSW will continue to follow for D/C needs.  Alphonse Guild. Lanna Labella, LCSW, LCAS, CSI Transitions of Care Clinical Social Worker Care Coordination Department Ph: [phone]

## 2019-06-18 NOTE — ED Notes (Signed)
Pt eating his lunch. Feeding himself.

## 2019-06-18 NOTE — ED Notes (Signed)
Has slept all shift, snored most of the night. When writer went in to add a blanket or check on him he woke easily just to my presence but returned to sleep immediately.

## 2019-06-18 NOTE — ED Notes (Signed)
Pt's daughter Yetta Flock called and would like to receive a call when pt gets placed. [phone].

## 2019-06-18 NOTE — BH Assessment (Signed)
[place] Assessment Progress Note  Per Hampton Abbot, MD, this involuntary pt continues to require psychiatric hospitalization.  This morning this Probation officer received a voice message from [place] and [facility], reporting that pt remains under medical review, and that their physician is requesting a chest CT for the pt.  I spoke to EDP Dorie Rank, MD who agreed to order it, after which it was performed and results were faxed to [facility].  At 15:12 I called [place] and spoke to [place], who acknowledges receipt of the findings.  As of this writing, pt remains under medical review.  Jalene Mullet, [place] Coordinator [phone]

## 2019-06-18 NOTE — ED Notes (Signed)
Pt returned from CT and is taking a nap.

## 2019-06-18 NOTE — ED Notes (Signed)
Patient is agitated, taking off clothes and figeting in room, yelling out. PRN PO ativan given for anxiety, patient complaint and pleasant upon administration.

## 2019-06-19 DIAGNOSIS — G309 Alzheimer's disease, unspecified: Secondary | ICD-10-CM | POA: Diagnosis not present

## 2019-06-19 MED ORDER — LORAZEPAM 2 MG/ML IJ SOLN: 2.0000 mg | Freq: Four times a day (QID) | INTRAMUSCULAR | Status: DC | PRN

## 2019-06-19 MED ORDER — HALOPERIDOL LACTATE 5 MG/ML IJ SOLN
5.0000 mg | Freq: Once | INTRAMUSCULAR | Status: AC
  Administered 2019-06-19: 5 mg via INTRAVENOUS
  Filled 2019-06-19: qty 1

## 2019-06-19 MED ORDER — LORAZEPAM 2 MG/ML IJ SOLN
2.0000 mg | Freq: Four times a day (QID) | INTRAMUSCULAR | Status: DC | PRN
  Administered 2019-06-20 – 2019-06-27 (×6): 2 mg via INTRAMUSCULAR
  Filled 2019-06-19 (×6): qty 1

## 2019-06-19 MED ORDER — DIPHENHYDRAMINE HCL 50 MG/ML IJ SOLN
50.0000 mg | Freq: Four times a day (QID) | INTRAMUSCULAR | Status: DC | PRN
  Administered 2019-06-20 – 2019-06-29 (×6): 50 mg via INTRAMUSCULAR
  Filled 2019-06-19 (×6): qty 1

## 2019-06-19 MED ORDER — HALOPERIDOL LACTATE 5 MG/ML IJ SOLN: 5.0000 mg | Freq: Four times a day (QID) | INTRAMUSCULAR | Status: DC | PRN

## 2019-06-19 MED ORDER — LORAZEPAM 2 MG/ML IJ SOLN
2.0000 mg | Freq: Four times a day (QID) | INTRAMUSCULAR | Status: DC | PRN
  Administered 2019-06-19: 14:00:00 2 mg via INTRAMUSCULAR

## 2019-06-19 MED ORDER — LORAZEPAM 2 MG/ML IJ SOLN
INTRAMUSCULAR | Status: AC
  Administered 2019-06-19: 2 mg via INTRAMUSCULAR
  Filled 2019-06-19: qty 1

## 2019-06-19 MED ORDER — HALOPERIDOL LACTATE 5 MG/ML IJ SOLN
5.0000 mg | Freq: Four times a day (QID) | INTRAMUSCULAR | Status: DC | PRN
  Administered 2019-06-19: 5 mg via INTRAMUSCULAR

## 2019-06-19 MED ORDER — HALOPERIDOL LACTATE 5 MG/ML IJ SOLN
5.0000 mg | Freq: Four times a day (QID) | INTRAMUSCULAR | Status: DC | PRN
  Administered 2019-06-16 – 2019-07-01 (×12): 5 mg via INTRAMUSCULAR
  Filled 2019-06-19 (×11): qty 1

## 2019-06-19 MED ORDER — HALOPERIDOL LACTATE 5 MG/ML IJ SOLN: 5.0000 mg | Freq: Once | INTRAMUSCULAR | Status: DC

## 2019-06-19 MED ORDER — HALOPERIDOL LACTATE 5 MG/ML IJ SOLN
INTRAMUSCULAR | Status: AC
  Filled 2019-06-19: qty 1

## 2019-06-19 MED ORDER — DIPHENHYDRAMINE HCL 50 MG/ML IJ SOLN: 50.0000 mg | Freq: Four times a day (QID) | INTRAMUSCULAR | Status: DC | PRN
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
## 2019-06-19 NOTE — ED Notes (Signed)
Pt is resting, sitter at bedside
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
## 2019-06-19 NOTE — Progress Notes (Signed)
CSW spoke with "Kevin [name]," in admissions at [facility] [phone]) to follow up with referral faxed yesterday (10/23).   Kevin [name] reports this patient is on the waitlist and required referral documents have been received.  Stephanie Acre, LCSW-A Clinical Social Worker

## 2019-06-19 NOTE — ED Notes (Signed)
Patient refusing all medications this morning. Patient states he is not going to let people from off the streets give him drugs and he has never seen any of the staff in [place]. Patient became combative and attempted to leave. Notified MD.  Order received for 5mg  haldol.

## 2019-06-19 NOTE — ED Notes (Signed)
Pt is very restless, constantly moving from chair to bed and back, he is asking we call his mother to come and get him. Sitter is in the room with him, keeping him distracted.

## 2019-06-20 DIAGNOSIS — G309 Alzheimer's disease, unspecified: Secondary | ICD-10-CM | POA: Diagnosis not present

## 2019-06-20 NOTE — ED Notes (Signed)
Pt is sitting in the hallway in the recliner, talking to the staff. He talks about football games, someone named Pamala Hurry, young people in college. So far, pt is calm and cooperative.

## 2019-06-20 NOTE — ED Notes (Signed)
Pt given dinner tray and set up to feed self.

## 2019-06-20 NOTE — ED Notes (Signed)
Pt walking around in hallway asking why all of these people are in his house. Pt getting agitated with staff. Pt sitting in recliner at this time.

## 2019-06-20 NOTE — ED Notes (Signed)
Pt being combative towards staff, pt upset because we are "uninvited in his home" and we wont allow his door to be shut. Security called after pt tried to [employer] and NT.

## 2019-06-20 NOTE — Progress Notes (Signed)
06/20/19 Patient is agitated and believes that the staff is on his property and wants the staff to leave his house. Tried to reorient patient but he states this is not a hospital and he knows what a hospital looks like.

## 2019-06-20 NOTE — ED Notes (Signed)
Pt given lunch tray and set up to feed self. 

## 2019-06-20 NOTE — ED Notes (Signed)
Pt still sleeping. Visualized chest rise and fall.

## 2019-06-20 NOTE — ED Notes (Signed)
Pt sitting in hallway calm and pleasant

## 2019-06-20 NOTE — ED Notes (Signed)
Pt moved into room 30. Pt helped into bed. Pt resting with eyes closed at this time.

## 2019-06-20 NOTE — ED Notes (Signed)
Pt resting at this time. Rise and fall of chest noted.

## 2019-06-20 NOTE — ED Notes (Signed)
Pt sitting in recliner in hallway, pt calm and cooperative.

## 2019-06-20 NOTE — ED Notes (Signed)
Pt resting in bed.

## 2019-06-20 NOTE — Consult Note (Signed)
  Patient refuses to participate in tele psychiatry assessment today. Patient observed in bed, verbalizes "Is there a bird in the house?" Patient encouraged by RN, at bedside, to participate in assessment, patient answers, "too bad."  Inpatient treatment recommended.  Will attempt to see patient tomorrow.

## 2019-06-21 ENCOUNTER — Encounter (HOSPITAL_COMMUNITY): Payer: Self-pay | Admitting: Registered Nurse

## 2019-06-21 DIAGNOSIS — G309 Alzheimer's disease, unspecified: Secondary | ICD-10-CM | POA: Diagnosis not present

## 2019-06-21 NOTE — Consult Note (Signed)
  Patient observed sitting in a recliner in hallway.  Pleasant mood at this moment but easily agitated when asked to go to his room..  Patient continues to lash out at staff.  Patient unable to participate in MSE related to acuity of condition.   Recommendations: Continue to seek [facility] psych inpatient. No changes to medications at this time.
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
## 2019-06-22 DIAGNOSIS — G309 Alzheimer's disease, unspecified: Secondary | ICD-10-CM | POA: Diagnosis not present

## 2019-06-22 LAB — AMMONIA: Ammonia: 17 umol/L (ref 9–35)

## 2019-06-22 LAB — VALPROIC ACID LEVEL: Valproic Acid Lvl: <10 ug/mL — ABNORMAL LOW (ref 50.0–100.0)
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
## 2019-06-22 NOTE — ED Notes (Signed)
Patient sleeping

## 2019-06-22 NOTE — Progress Notes (Signed)
Pt's daughter called for an update and was informed pt has still been assessed by psychiatry as appropriate for inpatient psychiatric.  Pt's daughter voiced understanding and was appreciative and thanked the CSW.  Please reconsult if future social work needs arise.  CSW signing off, as social work intervention is no longer needed.  Alphonse Guild. Tashina Credit, LCSW, LCAS, CSI Transitions of Care Clinical Social Worker Care Coordination Department Ph: [phone]

## 2019-06-22 NOTE — ED Notes (Signed)
Patient agitated, trying to get up out of chair. Unsteady gait.

## 2019-06-22 NOTE — ED Notes (Signed)
Pt awake, alert & responsive, no distress noted, agitated, attempting to leave room, pt high fall risk., with assist of walker.  Security at bedside to standby for medication administration.  Pt took with out incident.  1-1 sitter remains at bedside.

## 2019-06-22 NOTE — ED Notes (Signed)
Security called to pts bedside. Pt insisting on leaving. Pt attempting to leave room. Pt up out of bed

## 2019-06-22 NOTE — BH Assessment (Addendum)
[facility] Assessment Progress Note  Per Neita Garnet, MD, this pt continues to require psychiatric hospitalization. Dr Parke Poisson also finds that pt continues to meet criteria for IVC.  IVC under which pt initially presents expires today, and Dr Parke Poisson has initiated a new one.  IVC documents have been faxed to [facility], and at [employer] confirms receipt.  She has since faxed Findings and Custody Order to this Probation officer.  At 12:59 I called [employer] and spoke to [employer], who took demographic information, agreeing to dispatch law enforcement to fill out Return of Service.  Law enforcement then presented at [facility], completing Return of Service.  New IVC documents, along with other updated information from pt's chart, have been faxed to [facility], and at 15:04 Gwyndolyn Saxon confirms receipt.  Final decision is pending as of this writing.  Jalene Mullet, [place] [facility] Coordinator [phone]   Addendum:  Results of new labs for ammonia and valproic acid levels have been faxed to [facility].  At 16:31 Maudie Mercury confirms receipt.  Jalene Mullet, [place] Coordinator [phone]

## 2019-06-22 NOTE — ED Notes (Signed)
Pt resting in bed comfortably at this time. Will continue to monitor.

## 2019-06-22 NOTE — BH Assessment (Signed)
[facility] Assessment Progress Note  At 08:39 this writer called [facility] [place] and spoke to EJ.  He reports that pt remains on their wait list as a priority referral.  As of this writing, final disposition is pending.  Jalene Mullet, [place] Coordinator [phone]

## 2019-06-22 NOTE — ED Notes (Signed)
Pt assisted up to recliner to sit for a while. Pt is calm and cooperative at this time.

## 2019-06-22 NOTE — ED Notes (Signed)
Pt becoming increasingly agitated. Pt requesting clothes so that he may leave. Pt sts that he "doesn't understand why he is in jail and cant leave" Pt informed that the MD will be in to evaluate him. Pt becoming anxious about leaving. Will administer PRN due to escalating behavior.
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
## 2019-06-22 NOTE — ED Notes (Signed)
Pt provided with lunch tray.

## 2019-06-23 DIAGNOSIS — G309 Alzheimer's disease, unspecified: Secondary | ICD-10-CM | POA: Diagnosis not present

## 2019-06-23 NOTE — Consult Note (Signed)
  Chart review and lab review:   Patient continues to have episodes of agitation and aggression.   Ammonia WNL Valproic Acid < 10.  Patient missed 2 doses 06/22/19.     Recommendation/Disposition:  Inpatient psychiatric treatment.  Patient on [facility] wait list.    No changes at this time

## 2019-06-23 NOTE — ED Notes (Signed)
Sitter with patient in room as patient is sitting on toilet attempting to use the restroom. Pt becoming aggravated and refusing to get off of the toilet. PRN meds will be given to calm patient down. MD [place] made aware.

## 2019-06-23 NOTE — ED Notes (Signed)
Pt attempting to get out of bed.  Pt high fall risk with confusion stating he wants to go downstairs.  Pt constantly re-oriented to unit.  Pt sitting in recliner chair at present.

## 2019-06-23 NOTE — ED Notes (Signed)
Observed easy rise and fall of respirations.
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
## 2019-06-23 NOTE — ED Notes (Signed)
Urinated in brief, then tried to get up to use the bathroom in the corner. Seemed like a habitual action per staff. He got up and tried to walk to the right. But his path was obstructed. Back to bed sleeping.

## 2019-06-23 NOTE — ED Notes (Signed)
In bed with eyes closed.

## 2019-06-23 NOTE — BH Assessment (Signed)
[facility] Assessment Progress Note  At 08:15 this writer called [facility] and spoke to Ron.  He confirms that pt remains on their wait list as a priority referral.  Final disposition is pending as of this writing.  Jalene Mullet, [place] Coordinator [phone]

## 2019-06-24 DIAGNOSIS — G309 Alzheimer's disease, unspecified: Secondary | ICD-10-CM | POA: Diagnosis not present
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
## 2019-06-25 DIAGNOSIS — G309 Alzheimer's disease, unspecified: Secondary | ICD-10-CM | POA: Diagnosis not present

## 2019-06-25 NOTE — ED Notes (Signed)
Pt irritable, uncooperative, confused, no insight.  Pt refused AM medication.
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
## 2019-06-25 NOTE — Progress Notes (Signed)
Received Kevin [name] this PM at the change of shift, awake in his room with the sitter at the bedside. He was verbally abusive at intervals and very irritable. He was compliant with his medications. He is OOB to the bathroom with his walker.He eventually drifted off to sleep and slept throughout the early morning hours.

## 2019-06-25 NOTE — Progress Notes (Addendum)
Received Kevin [name] this PM at the change of shift in a Geri chair with the sitter at the bedside. He is pleasant and cognitively disorganized. He is ambulating with his walker at intervals and using the bathroom without incident. Later he called his wife, afterwards he returned to his bed and stated he feels better.  At 2200 hrs he was slamming his door to his room, walking about without his walker and cursing. He refused to allow the sitter to monitor his activity. He stripped his bed of linen. He was medicated with haldol and benadryl. He sat on the side of the bed until 0430 calmer,but did not go to sleep until 0430hrs.

## 2019-06-26 DIAGNOSIS — G309 Alzheimer's disease, unspecified: Secondary | ICD-10-CM | POA: Diagnosis not present

## 2019-06-26 NOTE — ED Notes (Signed)
Pt repeatedly calling this NT Benjamine Mola and saying "Call your mother and tell her were here". Pt reminded we are at the hospital.

## 2019-06-26 NOTE — ED Notes (Signed)
Up walking in the hall (with walker) with security and NT

## 2019-06-26 NOTE — ED Notes (Signed)
Sitting quielty in chair

## 2019-06-26 NOTE — ED Notes (Signed)
Pt woke up and began wondering around in hallway saying he was "going downstairs". Pt redirected multiple times away from exits. Pt asking where his razor is. Pt given toothbrush and toothpaste per request. Pt becoming increasingly agitated with this NT asking "can you go do something and leave me alone". Pt in room at this time.

## 2019-06-26 NOTE — ED Notes (Signed)
Pt ambulated to restroom and back to bed.

## 2019-06-26 NOTE — ED Notes (Signed)
Addendum to previous note.  Patient told sitter that he would take his medications.  Nurse notified and medications given po with juice.

## 2019-06-26 NOTE — ED Notes (Signed)
Pt wanted to lie in bed, pt pulled up in bed and helped get comfortable. Pt resting with eyes closed.

## 2019-06-26 NOTE — ED Notes (Signed)
Pt given breakfast tray and set up to feed self.

## 2019-06-26 NOTE — ED Notes (Signed)
Pt up and down from chair in the hallway. Pt wanted to sit in bed for a while, pt assisted to do so.

## 2019-06-26 NOTE — ED Notes (Signed)
Nurse prepared to give patient his 10AM meds.  Patient did not want nurse to come near him.  Nurse first offered applesauce in an attempt to see if he might take medications crushed but he refused the applesauce and all of his medications.  He has been walking around quite a bit in his room with his walker and his sitter assisting.  He is in and out of the bed and his agitation appears to be increasing.  He is not aware of his surroundings not understanding he is in the hospital.  Patient thinks he is still at home.  Patient is able to toilet on his own, but has refused any attemps for assistance with bathing.  He did ask for a tooth brush and brushed his teeth without assistance at the sink.  He is now lying in his bed with eyes closed.  Patient is able to eat on his own with set up by sitter.  Needs constant redirection at times to stay in his room.  Sitter has walked with him around the unit with his walker.  Patient says he needs to go downstairs.

## 2019-06-26 NOTE — ED Notes (Signed)
Talking w/ sitter in hall
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
## 2019-06-26 NOTE — ED Provider Notes (Signed)
Vitals:   06/25/19 2015 06/26/19 0600  BP: (!) 142/67 (!) 141/85  Pulse: 84 72  Resp: 20 20  Temp: 98 F (36.7 C) 97.6 F (36.4 C)  SpO2: 99% 99%   Pt has been medically cleared.  CT scan ordered on the 23rd based on cxr finding..  Hiatal hernia noted.  2.3 cm nodule noted in left lobe.  Pt without fever, resp sx.  Doubt infection. Follow up evaluation recommended. Possible t10 fx compression fx noted.  Plan to tx pain prn. Pending psychiatric inpatient treatment.   Dorie Rank, MD 06/26/19 [phone]

## 2019-06-26 NOTE — ED Notes (Signed)
Pt sleeping at this time.

## 2019-06-26 NOTE — ED Notes (Signed)
Pt wanted to call wife on the phone. Wife was called 2x with no answer. Daughter was called and spoke with pt.

## 2019-06-26 NOTE — ED Notes (Signed)
Sitting in hall eating

## 2019-06-26 NOTE — ED Notes (Signed)
Pt increasingly agitated, wanting to leave, redirectable.  Talking on the phone with his daughter.

## 2019-06-26 NOTE — ED Notes (Signed)
Pt given lunch tray and set up to feed self.

## 2019-06-26 NOTE — ED Notes (Signed)
Py lying in bed asking this NT "where is your mother". Pt also asking when Benjamine Mola is coming. Pt reminded he is at the hospital which pt is not happy about.
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
## 2019-06-26 NOTE — ED Notes (Signed)
Sitting in hall talking w/ NT

## 2019-06-26 NOTE — ED Notes (Signed)
Pt sitting in recliner chair in hallway, pt calm and pleasant at this time.

## 2019-06-26 NOTE — ED Notes (Signed)
Pt made phone call to wife. Pt ambulated from bed to recliner chair in the hallway. Pt given ensure.

## 2019-06-27 DIAGNOSIS — G309 Alzheimer's disease, unspecified: Secondary | ICD-10-CM | POA: Diagnosis not present

## 2019-06-27 NOTE — ED Notes (Signed)
Pt asleep at present 

## 2019-06-27 NOTE — Progress Notes (Addendum)
Patient seen and reassessed today.  He continues to meet criteria for inpatient hospitalization.  He is awaiting gero psych inpatient hospitalization.   Patient seen face-to-face for psychiatric evaluation, chart reviewed and case discussed with the physician extender and developed treatment plan. Reviewed the information documented and agree with the treatment plan. Corena Pilgrim, MD
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
## 2019-06-27 NOTE — ED Notes (Addendum)
Pt assisted to the restroom. Pt provided with a clean pair of pants due to pt soiling the last pair. Pt assisted back to bed and positioned to comfort. Vital signs updated and pt resting comfortably in bed. Pt did complain of some pain behind his right ear.

## 2019-06-28 ENCOUNTER — Encounter (HOSPITAL_COMMUNITY): Payer: Self-pay | Admitting: Registered Nurse

## 2019-06-28 DIAGNOSIS — G309 Alzheimer's disease, unspecified: Secondary | ICD-10-CM | POA: Diagnosis not present

## 2019-06-28 NOTE — ED Notes (Signed)
Patient in bed sleeping. Resting quietly.

## 2019-06-28 NOTE — Consult Note (Addendum)
  Kevin [name], 85 y.o., male patient seen via tele psych by this provider, Dr. Parke Poisson; and chart reviewed on 06/28/19.  On evaluation Kevin [name] laying in bed; he was able to follow direction and sit up in bed for assessment.  Patient stated that he was doing "pretty good" Asked about sleeping/eating "I sleep most of the time.  I've eat what I had.  I don't see anything right now".  Patient asked if he was having any thoughts of wanting to hurt or kill himself "No, I love myself"  Patient asked if he knew what year it was "[birth year]" and then asked his age "89."  Patient appears to be doing somewhat better this morning.  According to chart review patient continues to get agitated and lashes out at staff; still having episodes of confusion, physical aggression and verbally   Disposition:  Continue to seek inpatient gero psych placement.  Patient on wait list at [facility].   Shuvon B. Rankin, NP  Attest to NP note

## 2019-06-28 NOTE — BH Assessment (Signed)
[place] Assessment Progress Note  At 08:05 this writer called [facility] and spoke to Ron.  He reports that pt remains on the priority wait list.  Jalene Mullet, [place] Coordinator [phone]

## 2019-06-29 ENCOUNTER — Encounter (HOSPITAL_COMMUNITY): Payer: Self-pay | Admitting: Registered Nurse

## 2019-06-29 DIAGNOSIS — G309 Alzheimer's disease, unspecified: Secondary | ICD-10-CM | POA: Diagnosis not present

## 2019-06-29 NOTE — BH Assessment (Addendum)
Clinician received a list of requested information from [facility] to assist in placing pt; this list was obtained from clinician Deidre, TTS. Upon obtaining the requested information, it is to be faxed to [facility] at [phone] Attn: Admission RN.  1) Most recent IVC paperwork 2) The last 5 days of medical records 3) The last 5 days of vitals 4) The most recent physician notes 5) The most recent psychiatric notes 6) A COVID test that was taken within the last 2 days 

## 2019-06-29 NOTE — ED Notes (Signed)
Becoming agitated, demanding to leave and not accepting the tech telling him he is in the hospital. States we cant make him stay here he is expecting his wife to come now and pick him up. PRN IM meds given as ordered for agitation. He accepted the injection without a fight.

## 2019-06-29 NOTE — Consult Note (Addendum)
[facility] Progress Note  06/29/2019 11:25 AM Kevin [name]  MRN:  [id number]    Subjective:  Kevin [name], 85 y.o., male patient seen via tele psych by this provider, Dr. Jama [name]; and chart reviewed on 06/29/19.  On evaluation Kevin [name] is sitting up in [name].  RN reports patient was a little hostile and wanted to leave but did not strike out at anyone.  Currently states patient is having a good day.  Patient does appear to be in a better mood pleasant smiling.  When asked how he was doing patient responded "I'm good.  How are you doing?  You're good.  I'm just checking in with you.  I'm sure you are going to slow down in a couple years."  Patient states that he is sleeping okay.  And reports that his appetite is " always good."  Patient asked the day of the month and he responded " 25 th" for the year " 1962" when asked his location or if he knew where he was patient responded " [place] time we came here." During evaluation Kevin [name] is alert/oriented  To self; calm/cooperative; and mood is pleasant.  He does not appear to be responding to internal/external stimuli or delusional thoughts.  Patient denies suicidal "I love my life." Patient also denies homicidal ideation.     Reviewed Medication history:  Valproic acid level ordered 06/22/19 but patient had missed several days/doses of Depakote; Valproic Acid level <10.  Patient has been taking Depakote Bid as order for the last 3 days which may have something to do with the improvement in mood during the mornings.  Will need to recheck Valproic Acid level in the next 2 days or so.      Principal Problem: Dementia with behavioral disturbance (H[facility]) Diagnosis:  Principal Problem:   Dementia with behavioral disturbance (H[facility])  Total Time spent with patient: 20 minutes   Past Medical History:  Past Medical History:  Diagnosis Date  . BPH (benign prostatic hyperplasia)   . Coronary artery disease   .  Dementia (HCC)   . Diabetes mellitus without complication (HCC)   . Dizziness   . Dyspnea   . GERD (gastroesophageal reflux disease)   . Gout   . Hypotension   . Neuropathy   . Thyroid disease    History reviewed. No pertinent surgical history. Family History: History reviewed. No pertinent family history. Family Psychiatric  History: [place] Social History:  Social History   Substance and Sexual Activity  Alcohol Use Not Currently     Social History   Substance and Sexual Activity  Drug Use Not Currently    Social History   Socioeconomic History  . Marital status: Married    Spouse name: Not on file  . Number of children: Not on file  . Years of education: Not on file  . Highest education level: Not on file  Occupational History  . Not on file  Social Needs  . Financial resource strain: Not on file  . Food insecurity    Worry: Not on file    Inability: Not on file  . Transportation needs    Medical: Not on file    Non-medical: Not on file  Tobacco Use  . Smoking status: Unknown If Ever Smoked  . Smokeless tobacco: Never Used  Substance and Sexual Activity  . Alcohol use: Not Currently  . Drug use: Not Currently  . Sexual activity: Not on file  Lifestyle  .  Physical activity    Days per week: Not on file    Minutes per session: Not on file  . Stress: Not on file  Relationships  . Social Herbalist on phone: Not on file    Gets together: Not on file    Attends religious service: Not on file    Active member of club or organization: Not on file    Attends meetings of clubs or organizations: Not on file    Relationship status: Not on file  Other Topics Concern  . Not on file  Social History Narrative  . Not on file    Sleep: Fair  Appetite:  Fair  Current Medications: Current Facility-Administered Medications  Medication Dose Route Frequency Provider Last Rate Last Dose  . acetaminophen (TYLENOL) tablet 500 mg  500 mg Oral Q4H PRN  Charlesetta Shanks, MD   500 mg at 06/24/19 2239  . allopurinol (ZYLOPRIM) tablet 100 mg  100 mg Oral Daily Dorie Rank, MD   100 mg at 06/29/19 1112  . amLODipine (NORVASC) tablet 2.5 mg  2.5 mg Oral Daily Charlesetta Shanks, MD   2.5 mg at 06/29/19 1110  . diphenhydrAMINE (BENADRYL) injection 50 mg  50 mg Intravenous Once Drenda Freeze, MD      . LORazepam (ATIVAN) injection 2 mg  2 mg Intramuscular Q6H PRN Patrecia Pour, NP   2 mg at 06/27/19 0044   And  . haloperidol lactate (HALDOL) injection 5 mg  5 mg Intramuscular Q6H PRN Patrecia Pour, NP   5 mg at 06/27/19 2233   And  . diphenhydrAMINE (BENADRYL) injection 50 mg  50 mg Intramuscular Q6H PRN Patrecia Pour, NP   50 mg at 06/25/19 2318  . divalproex (DEPAKOTE) DR tablet 125 mg  125 mg Oral BID Dorie Rank, MD   125 mg at 06/29/19 1111  . feeding supplement (ENSURE ENLIVE) (ENSURE ENLIVE) liquid 237 mL  237 mL Oral TID BM Dorie Rank, MD   237 mL at 06/28/19 1857  . magnesium oxide (MAG-OX) tablet 400 mg  400 mg Oral Daily Lenis Noon, RPH   400 mg at 06/29/19 1111   And  . pyridOXINE (VITAMIN B-6) tablet 25 mg  25 mg Oral Daily Lenis Noon, RPH   25 mg at 06/29/19 1111  . sertraline (ZOLOFT) tablet 50 mg  50 mg Oral Q breakfast Charlesetta Shanks, MD   50 mg at 06/29/19 1110  . traZODone (DESYREL) tablet 100 mg  100 mg Oral QHS PRN Charlesetta Shanks, MD   100 mg at 06/28/19 2144  . vitamin B-12 (CYANOCOBALAMIN) tablet 1,000 mcg  1,000 mcg Oral Daily Dorie Rank, MD   1,000 mcg at 06/29/19 1111   Current Outpatient Medications  Medication Sig Dispense Refill  . acetaminophen (TYLENOL) 500 MG tablet Take 500 mg by mouth every 4 (four) hours as needed for moderate pain.    Marland Kitchen allopurinol (ZYLOPRIM) 100 MG tablet Take 100 mg by mouth daily.    Marland Kitchen amLODipine (NORVASC) 2.5 MG tablet Take 2.5 mg by mouth daily.    . divalproex (DEPAKOTE) 125 MG DR tablet Take 125 mg by mouth 2 (two) times daily.    . Ensure (ENSURE) Take 237 mLs by mouth 3  (three) times daily between meals.    Marland Kitchen LORazepam (ATIVAN) 0.5 MG tablet Take 0.5 mg by mouth 2 (two) times daily.    Marland Kitchen LORazepam (ATIVAN) 1 MG tablet Take 1 mg by mouth  2 (two) times daily as needed for anxiety.    . magnesium oxide-pyridoxine (BEELITH) 362-20 MG TABS tablet Take 1 tablet by mouth daily.    . memantine (NAMENDA) 10 MG tablet Take 10 mg by mouth 2 (two) times daily.    . Menthol-Methyl Salicylate (ICY HOT EXTRA STRENGTH) 10-30 % CREA Apply 1 application topically every 6 (six) hours as needed (for Pain).    Marland Kitchen OLANZapine (ZYPREXA) 5 MG tablet Take 5 mg by mouth daily.    . sertraline (ZOLOFT) 50 MG tablet Take 50 mg by mouth daily.    . traZODone (DESYREL) 100 MG tablet Take 100 mg by mouth at bedtime as needed for sleep.    . vitamin B-12 (CYANOCOBALAMIN) 1000 MCG tablet Take 1,000 mcg by mouth daily.      Lab Results: No results found for this or any previous visit (from the past 48 hour(s)).  Blood Alcohol level:  Lab Results  Component Value Date   [facility] <10 06/15/2019   Treatment Plan Summary: Daily contact with patient to assess and evaluate symptoms and progress in treatment and Medication management  Disposition/Recommendation:  Inpatient gero psychiatric treatment.  Patient continues to be on the [facility] wait list.    Ordered:  Valproic Acid ordered for 07/01/19.  Shuvon Rankin, NP 06/29/2019, 11:25 AM\  Attest to NP note

## 2019-06-29 NOTE — ED Notes (Signed)
Calmer and put in bed but not sleeping.

## 2019-06-29 NOTE — ED Notes (Addendum)
Clinician received a list of requested information from [facility] to assist in placing pt; this list was obtained from clinician Deidre, TTS. Upon obtaining the requested information, it is to be faxed to [facility] at [phone] Attn: Admission RN.  1) Most recent IVC paperwork 2) The last 5 days of medical records 3) The last 5 days of vitals 4) The most recent physician notes 5) The most recent psychiatric notes 6) A COVID test that was taken within the last 2 days

## 2019-06-29 NOTE — BH Assessment (Signed)
[place] Assessment Progress Note  Pt remains on [place] wait list.  IVC expires today.  Per Neita Garnet, MD pt continues to meet criteria for IVC, which he has initiated.  IVC documents have been faxed to [facility], and at [employer] confirms receipt.  She has since faxed Findings and Custody Order to this Probation officer.  At 13:47 I called [employer] and spoke to [employer], who took demographic information, agreeing to dispatch law enforcement to fill out Return of Service.  Law enforcement then presented at [facility], completing Return of Service.  Final disposition is pending as of this writing.  Jalene Mullet, [place] Coordinator [phone]

## 2019-06-29 NOTE — BH Assessment (Signed)
[place] Assessment Progress Note  At 08:13 this writer called [facility] and spoke to [place].  He reports that pt remains on the priority wait list.  Jalene Mullet, [place] Coordinator [phone]

## 2019-06-30 DIAGNOSIS — G309 Alzheimer's disease, unspecified: Secondary | ICD-10-CM | POA: Diagnosis not present

## 2019-06-30 LAB — SARS CORONAVIRUS 2 BY RT PCR (HOSPITAL ORDER, PERFORMED IN ~~LOC~~ HOSPITAL LAB): SARS Coronavirus 2: NEGATIVE

## 2019-06-30 NOTE — BH Assessment (Signed)
[facility] Assessment Progress Note  Supplemental information requested by [facility] has been faxed to them, and at 12:23 Gwyndolyn Saxon confirms receipt.  As of this writing final disposition is pending.  Jalene Mullet, [place] Coordinator [phone]

## 2019-06-30 NOTE — ED Provider Notes (Signed)
Patient is an 85 year old gentleman who was pending psychiatric admission.  Patient is under IVC.  He has been medically cleared.  He received a dose of IM medications for sedation due to agitation yesterday evening around 10 PM, otherwise no acute complaints from nursing staff overnight or this morning.  Patient is resting during my evaluation.  His vitals have been stable.  Behavioral health team is assisting with inpatient placement.   Wyvonnia Dusky, MD 06/30/19 [phone]

## 2019-06-30 NOTE — ED Notes (Signed)
Sent to [facility] this am some of the materials they have requested in further consideration of this patients admission. Sent five days of vitals, last note from the psychiatrist and last note from the [facility]. Covid test to be done this am. Sent to [facility] admission office.

## 2019-06-30 NOTE — ED Notes (Signed)
Faxed the current IVC paperwork as requested to [facility] admission office.

## 2019-06-30 NOTE — Progress Notes (Signed)
Received Kevin [name] this PM at the change of shift awake in his room with the sitter at the bedside. He remains disorganized with with difficulty to redirect. He eventually drifted off to sleep.
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
## 2019-06-30 NOTE — BH Assessment (Signed)
[facility] Assessment Progress Note  Per Letitia Libra, FNP, this pt continues to require psychiatric hospitalization.  Pt presents under IVC renewed by Neita Garnet, MD on 06/29/2019.  At 13:06 Gwyndolyn Saxon calls from [facility] to report that pt has been accepted to their facility and is to be transported to them tomorrow morning (Thursday, 07/01/2019).  Otila Kluver concurs with this decision.  Pt's nurse, Eustaquio Maize, has been notified, and agrees to call report to [phone] when the time comes.  Pt is to be transported via [facility].  [place] Coordinator [phone]

## 2019-06-30 NOTE — BH Assessment (Signed)
[facility] Assessment Progress Note  At 08:08 this writer called [facility] and spoke to Fisher. He reports that pt remains on the priority wait list.  Jalene Mullet, [place] Coordinator [phone]

## 2019-07-01 DIAGNOSIS — G309 Alzheimer's disease, unspecified: Secondary | ICD-10-CM | POA: Diagnosis not present

## 2019-07-01 LAB — CBG MONITORING, ED: Glucose-Capillary: 107 mg/dL — ABNORMAL HIGH (ref 70–99)

## 2019-07-01 NOTE — BH Assessment (Signed)
[facility] Assessment Progress Note   Clinician left message for [facility] regarding need for transport of patient to [facility] this morning.

## 2019-07-01 NOTE — ED Notes (Signed)
[facility] office called for transport.

## 2019-07-01 NOTE — ED Notes (Signed)
Patient is trying to get up while cussing and fussing.

## 2019-07-01 NOTE — ED Notes (Signed)
Called to give report  To [facility], RN is unavailable at this time.

## 2019-07-01 NOTE — ED Notes (Signed)
While trying to give patient medications, patient is verbally mean and grumpy. Took a few minutes to get patient to take medication.

## 2019-07-01 NOTE — ED Notes (Signed)
Patient requested to go to restroom and when nurse went in patient started cussing and states no one needs to be in there. Patient is using a walker.

## 2019-07-01 NOTE — ED Notes (Signed)
Kevin [name]

## 2019-07-05 NOTE — ED Notes (Addendum)
Healthcare POA form came through today for the pt and labels were printed for the paperwork.

## 2020-05-18 IMAGING — CT CT CERVICAL SPINE WITHOUT CONTRAST
3 of 4 series · 10 of 33 positions shown, 12 images · non-contrast
Comparison: None.

CLINICAL DATA: Pt had an unwitnessed fall at an unknown time. Pt
denied LOC and neck pain. Pt complains of right lower back pain. Pt
also had a fall 2 weeks ago. Pt does have dementia.

EXAM:
CT HEAD WITHOUT CONTRAST
CT CERVICAL SPINE WITHOUT CONTRAST
TECHNIQUE: Multidetector CT imaging of the head and cervical spine was
performed following the standard protocol without intravenous
contrast. Multiplanar CT image reconstructions of the cervical spine
were also generated.

[Series 8: c_spine 2.0 sag bone · sagittal · 0.25mm/px · 5 of 61 slices shown, 6 images]
[im 21/61  bone]
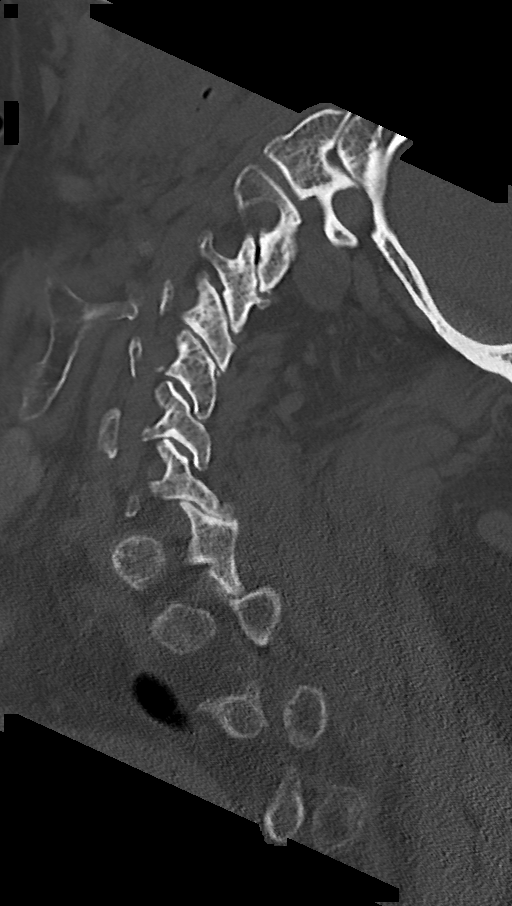
[im 26/61  bone]
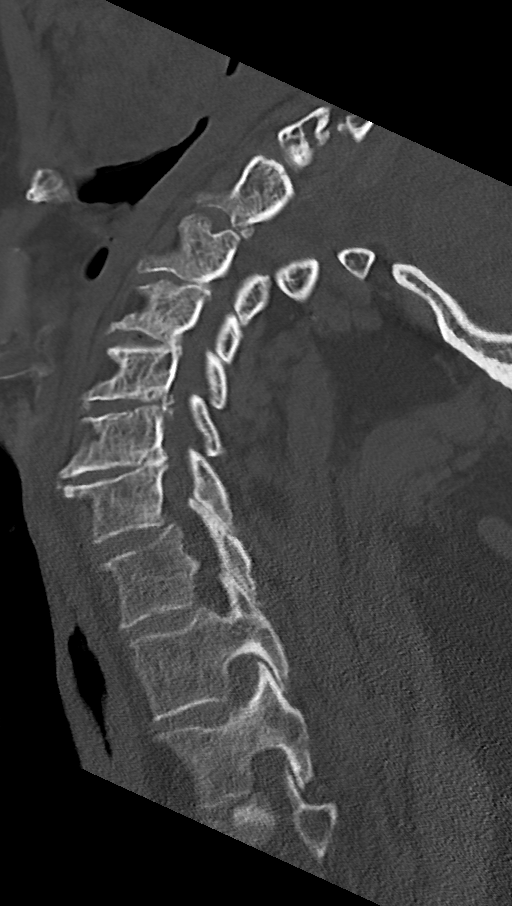
[im 31/61  soft-tissue]
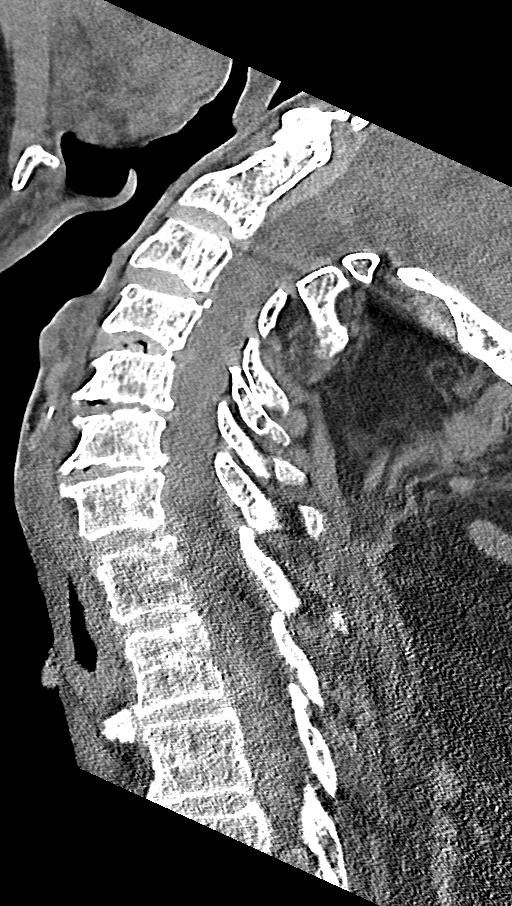
[im 31/61  bone]
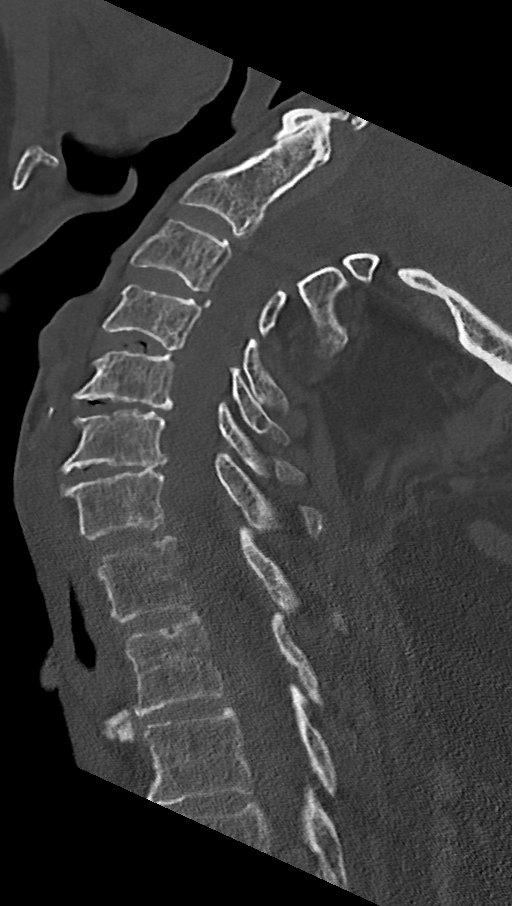
[im 36/61  bone]
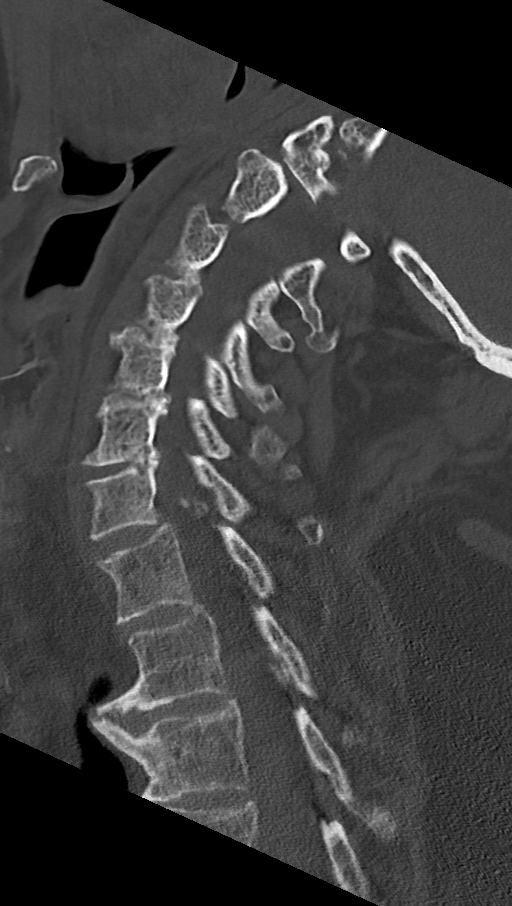
[im 41/61  bone]
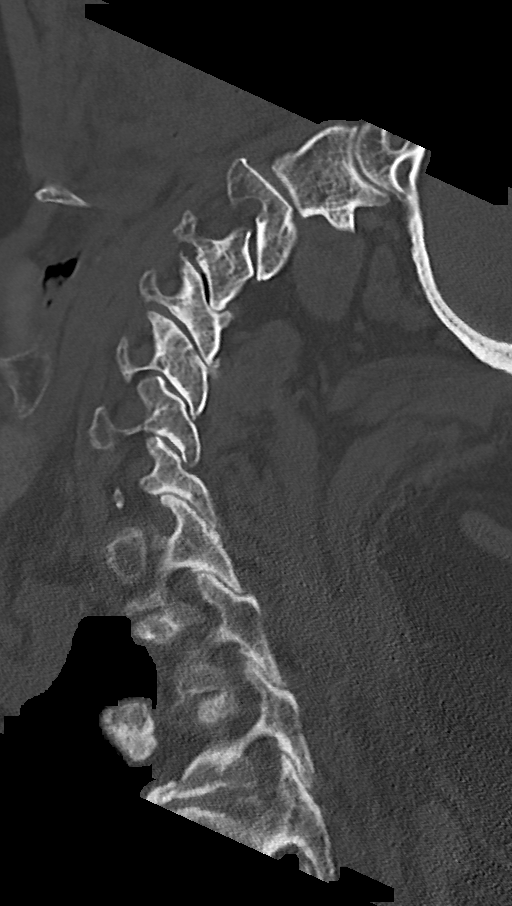

[Series 9: c_spine 2.0 cor bone · coronal · 0.23mm/px · 3 of 71 slices shown]
[im 16/71  bone]
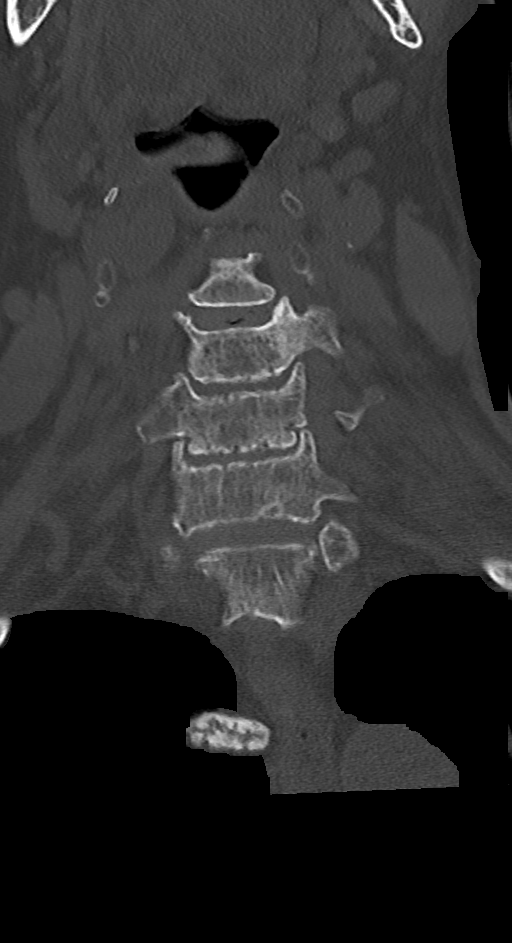
[im 29/71  bone]
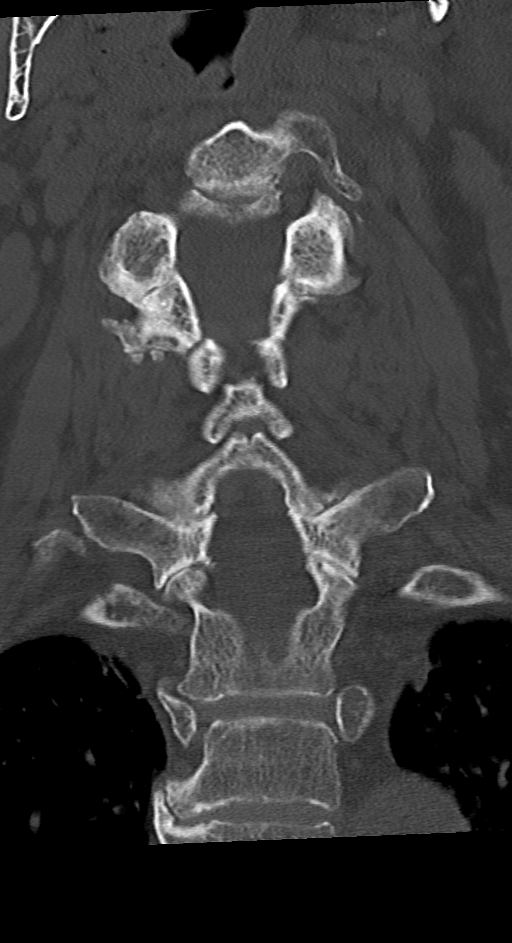
[im 42/71  bone]
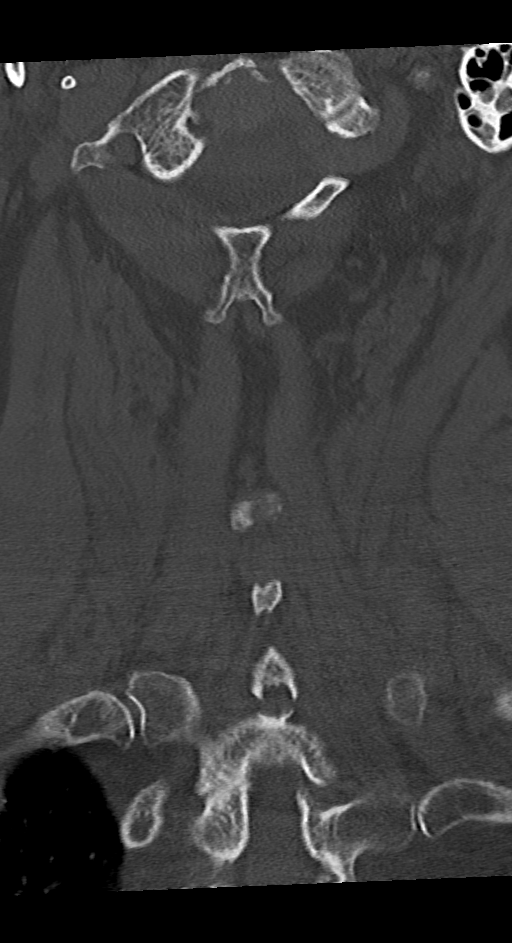

[Series 11: c_spine 1.0 st thins · axial · 0.32mm/px · z∈[+216,+295]mm · 2 of 255 slices shown, 3 images]
[im 85/255  soft-tissue]
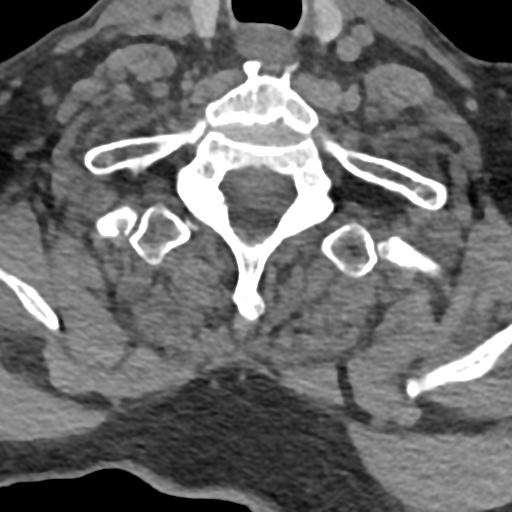
[im 85/255  bone]
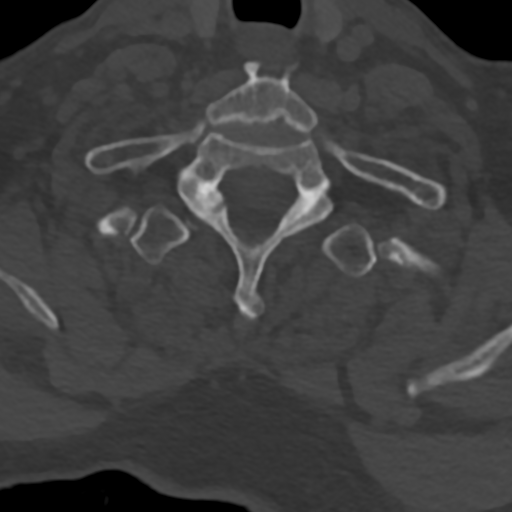
[im 198/255  bone]
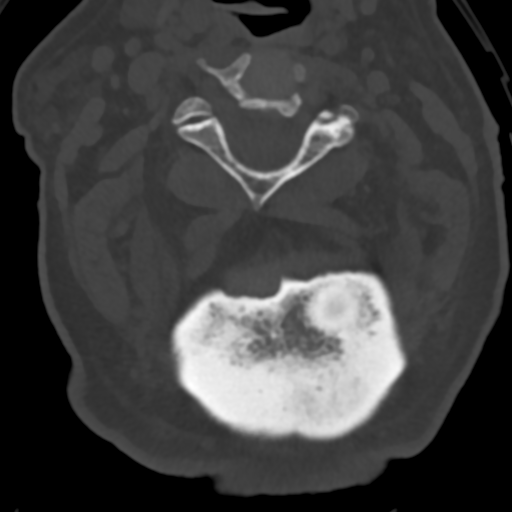

[10 of 33 positions shown; findings below may reference images not displayed]

FINDINGS: CT HEAD FINDINGS

Brain: No evidence of acute infarction, hemorrhage, hydrocephalus,
extra-axial collection or mass lesion/mass effect.

There is ventricular and sulcal enlargement reflecting moderate
generalized atrophy. There is an old infarct, right superolateral
frontal lobe, with another of the right parietal lobe. There is also
an old infarct of the left temporal lobe patchy white matter
hypoattenuation is seen more diffusely consistent with moderate
chronic microvascular ischemic change.

Vascular: No hyperdense vessel or unexpected calcification.

Skull: Normal. Negative for fracture or focal lesion.

Sinuses/Orbits: Globes and orbits are unremarkable. Sinuses and
mastoid air cells are clear.

Other: None.

CT CERVICAL SPINE FINDINGS

Alignment: Neck is held hyper extended. There is no
spondylolisthesis.

Skull base and vertebrae: No acute fracture. No primary bone lesion
or focal pathologic process.

Soft tissues and spinal canal: No prevertebral fluid or swelling. No
visible canal hematoma.

Disc levels: Mild loss of disc height at C4-C5 with moderate loss of
disc height at C5-C6 and C6-C7. There are facet degenerative changes
bilaterally, greater on the right most severe at C3-C4 and C4-C5. No
convincing disc herniation.

Upper chest: No acute findings.  Lung apices are clear.

Other: None.
IMPRESSION: HEAD CT

1. No acute intracranial abnormalities.
2. Atrophy, old infarcts and chronic microvascular ischemic change
as detailed.

CERVICAL CT

1. No fracture or acute finding.

## 2020-06-07 IMAGING — DX PORTABLE CHEST - 1 VIEW
1 series · 1 of 1 positions shown · non-contrast
Comparison: None.

CLINICAL DATA: Irregular heartbeat.

EXAM:
PORTABLE CHEST 1 VIEW

[chest ap]
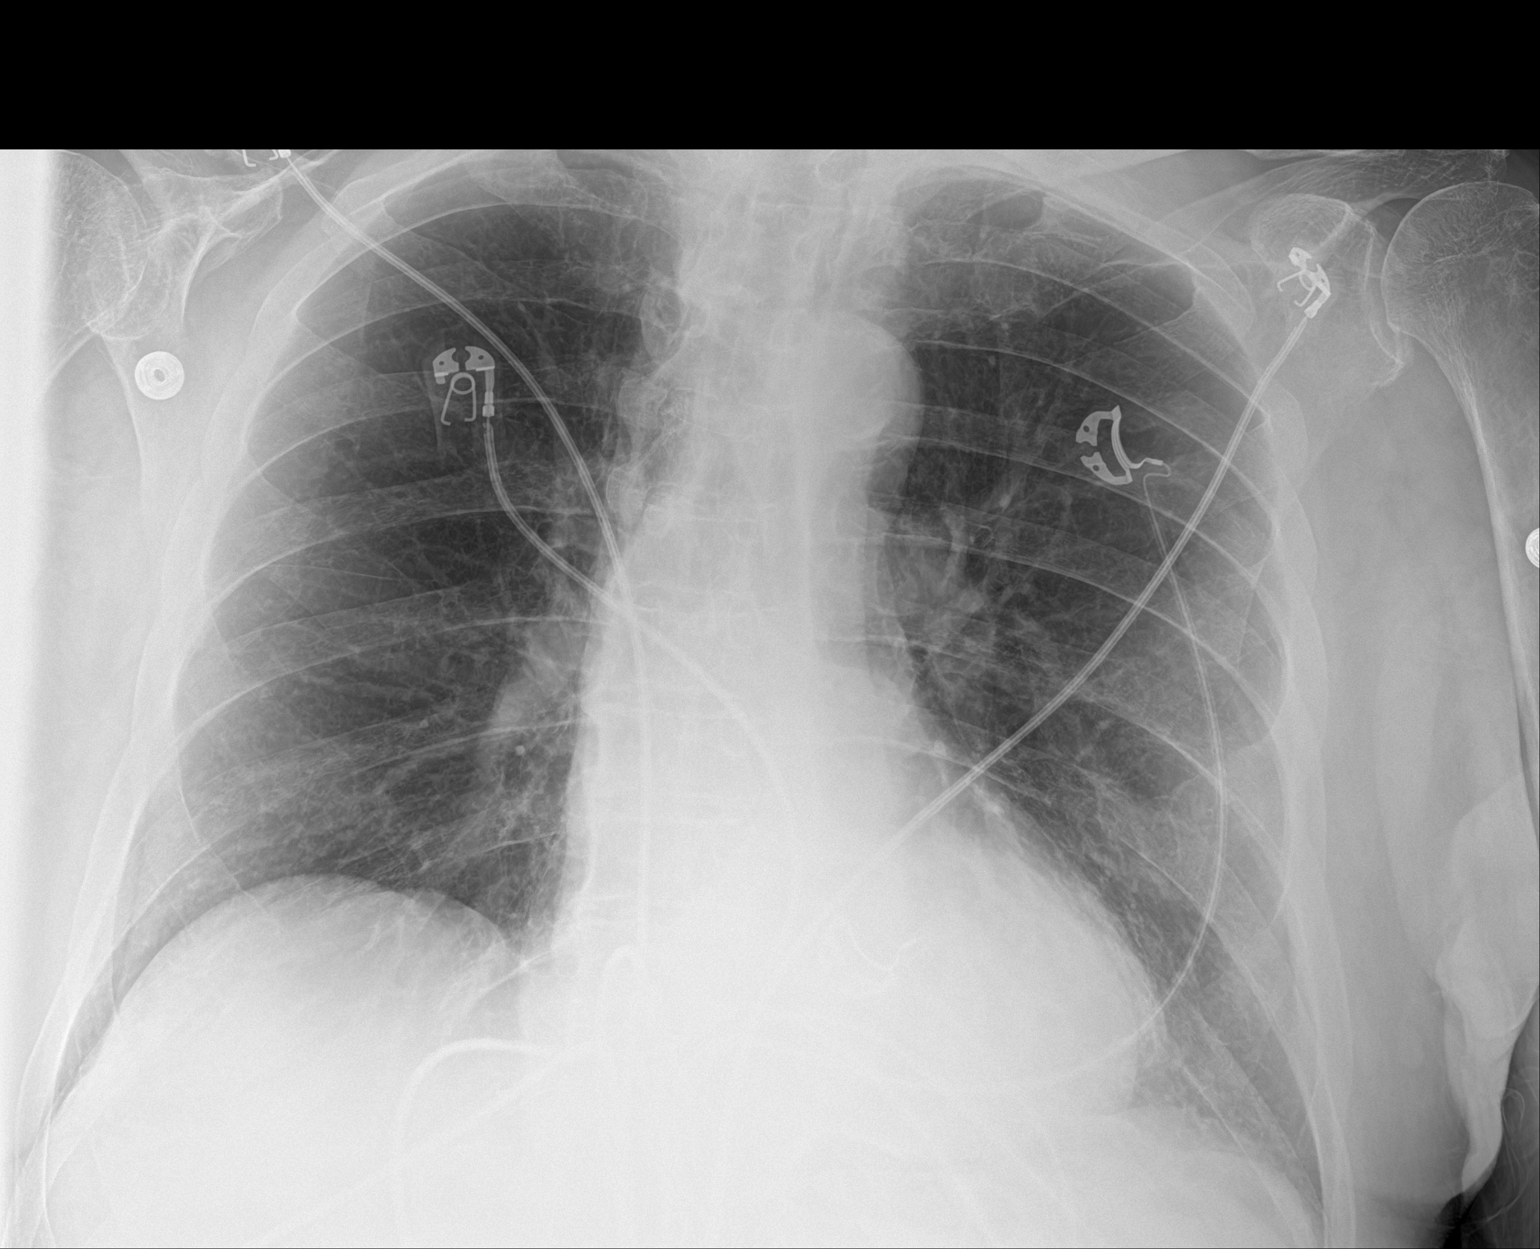

[1 of 1 positions shown; findings below may reference images not displayed]

FINDINGS: The mediastinal contour and cardiac silhouette are normal. The lungs
are clear. There is no focal infiltrate, pulmonary edema, or pleural
effusion. Bony structures demonstrate no acute abnormality.
IMPRESSION: No acute cardiopulmonary disease identified.

## 2020-10-03 ENCOUNTER — Emergency Department (HOSPITAL_COMMUNITY)
Admission: EM | Admit: 2020-10-03 | Discharge: 2020-10-03 | Disposition: A | Discharge status: Home / Self Care | Payer: Medicare Other | Attending: Emergency Medicine | Admitting: Emergency Medicine

## 2020-10-03 ENCOUNTER — Other Ambulatory Visit: Payer: Self-pay

## 2020-10-03 ENCOUNTER — Emergency Department (HOSPITAL_COMMUNITY): Payer: Medicare Other

## 2020-10-03 ENCOUNTER — Encounter (HOSPITAL_COMMUNITY): Payer: Self-pay

## 2020-10-03 DIAGNOSIS — Y92129 Unspecified place in nursing home as the place of occurrence of the external cause: Secondary | ICD-10-CM | POA: Diagnosis not present

## 2020-10-03 DIAGNOSIS — F0390 Unspecified dementia without behavioral disturbance: Secondary | ICD-10-CM | POA: Diagnosis not present

## 2020-10-03 DIAGNOSIS — Z043 Encounter for examination and observation following other accident: Secondary | ICD-10-CM | POA: Diagnosis present

## 2020-10-03 DIAGNOSIS — E119 Type 2 diabetes mellitus without complications: Secondary | ICD-10-CM | POA: Insufficient documentation

## 2020-10-03 DIAGNOSIS — I2510 Atherosclerotic heart disease of native coronary artery without angina pectoris: Secondary | ICD-10-CM | POA: Diagnosis not present

## 2020-10-03 DIAGNOSIS — W01198A Fall on same level from slipping, tripping and stumbling with subsequent striking against other object, initial encounter: Secondary | ICD-10-CM | POA: Insufficient documentation

## 2020-10-03 DIAGNOSIS — W19XXXA Unspecified fall, initial encounter: Secondary | ICD-10-CM
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
## 2020-10-03 NOTE — ED Provider Notes (Signed)
[facility]-EMERGENCY DEPT Provider Note   CSN: [id number] Arrival date & time: 10/03/20  1655     History Chief Complaint  Patient presents with  . Fall    Kevin [name] is a 87 y.o. male.  Fall at nursing home today.  Has no complaints.  Suspect that he hit his head.  Not on blood thinners.  Denies any extremity pain.  The history is provided by the patient.  Fall This is a new problem. Pertinent negatives include no chest pain, no abdominal pain, no headaches and no shortness of breath. Nothing aggravates the symptoms. Nothing relieves the symptoms. He has tried nothing for the symptoms. The treatment provided no relief.       Past Medical History:  Diagnosis Date  . BPH (benign prostatic hyperplasia)   . Coronary artery disease   . Dementia (HCC)   . Diabetes mellitus without complication (HCC)   . Dizziness   . Dyspnea   . GERD (gastroesophageal reflux disease)   . Gout   . Hypotension   . Neuropathy   . Thyroid disease     Patient Active Problem List   Diagnosis Date Noted  . Dementia with behavioral disturbance (HCC) 06/15/2019    History reviewed. No pertinent surgical history.     History reviewed. No pertinent family history.  Social History   Tobacco Use  . Smoking status: Unknown If Ever Smoked  . Smokeless tobacco: Never Used  Vaping Use  . Vaping Use: Never used  Substance Use Topics  . Alcohol use: Not Currently  . Drug use: Not Currently    Home Medications Prior to Admission medications   Medication Sig Start Date End Date Taking? Authorizing Provider  acetaminophen (TYLENOL) 500 MG tablet Take 500 mg by mouth every 4 (four) hours as needed for moderate pain.    [provider]  allopurinol (ZYLOPRIM) 100 MG tablet Take 100 mg by mouth daily. 01/25/19   [provider]  amLODipine (NORVASC) 2.5 MG tablet Take 2.5 mg by mouth daily. 03/25/19   [provider]  divalproex (DEPAKOTE) 125  MG DR tablet Take 125 mg by mouth 2 (two) times daily.    [provider]  Ensure (ENSURE) Take 237 mLs by mouth 3 (three) times daily between meals.    [provider]  LORazepam (ATIVAN) 0.5 MG tablet Take 0.5 mg by mouth 2 (two) times daily. 05/14/19   [provider]  LORazepam (ATIVAN) 1 MG tablet Take 1 mg by mouth 2 (two) times daily as needed for anxiety.    [provider]  magnesium oxide-pyridoxine (BEELITH) 362-20 MG TABS tablet Take 1 tablet by mouth daily.    [provider]  memantine (NAMENDA) 10 MG tablet Take 10 mg by mouth 2 (two) times daily. 03/25/19   [provider]  Menthol-Methyl Salicylate (ICY HOT EXTRA STRENGTH) 10-30 % CREA Apply 1 application topically every 6 (six) hours as needed (for Pain).    [provider]  OLANZapine (ZYPREXA) 5 MG tablet Take 5 mg by mouth daily.    [provider]  sertraline (ZOLOFT) 50 MG tablet Take 50 mg by mouth daily.    [provider]  traZODone (DESYREL) 100 MG tablet Take 100 mg by mouth at bedtime as needed for sleep.    [provider]  vitamin B-12 (CYANOCOBALAMIN) 1000 MCG tablet Take 1,000 mcg by mouth daily.    [provider]    Allergies  Patient has no known allergies.  Review of Systems   Review of Systems  Unable to perform ROS: Dementia  Respiratory: Negative for shortness of breath.   Cardiovascular: Negative for chest pain.  Gastrointestinal: Negative for abdominal pain.  Neurological: Negative for headaches.    Physical Exam Updated Vital Signs  ED Triage Vitals  Enc Vitals Group     BP 10/03/20 1726 (!) 114/102     Pulse Rate 10/03/20 1726 78     Resp 10/03/20 1726 16     Temp 10/03/20 1726 98.8 F (37.1 C)     Temp Source 10/03/20 1726 Oral     SpO2 10/03/20 1726 96 %     Weight 10/03/20 1728 190 lb (86.2 kg)     Height 10/03/20 1728 6\' 1"  (1.854 m)     Head Circumference --      Peak Flow --       Pain Score 10/03/20 1727 0     Pain Loc --      Pain Edu? --      Excl. in GC? --     Physical Exam Vitals and nursing note reviewed.  Constitutional:      General: He is not in acute distress.    Appearance: He is well-developed and well-nourished. He is not ill-appearing.  HENT:     Head: Normocephalic and atraumatic.     Comments: No obvious hematoma seen on exam on his head, no laceration    Nose: Nose normal.     Mouth/Throat:     Mouth: Mucous membranes are moist.  Eyes:     Extraocular Movements: Extraocular movements intact.     Conjunctiva/sclera: Conjunctivae normal.     Pupils: Pupils are equal, round, and reactive to light.  Cardiovascular:     Rate and Rhythm: Normal rate and regular rhythm.     Pulses: Normal pulses.     Heart sounds: Normal heart sounds. No murmur heard.   Pulmonary:     Effort: Pulmonary effort is normal. No respiratory distress.     Breath sounds: Normal breath sounds.  Abdominal:     Palpations: Abdomen is soft.     Tenderness: There is no abdominal tenderness.  Musculoskeletal:        General: No edema.     Cervical back: Neck supple.  Skin:    General: Skin is warm and dry.     Capillary Refill: Capillary refill takes less than 2 seconds.  Neurological:     General: No focal deficit present.     Mental Status: He is alert.     Cranial Nerves: No cranial nerve deficit.     Sensory: No sensory deficit.     Motor: No weakness.     Coordination: Coordination normal.     Comments: Patient pleasantly demented, often repeats that he is a 12/01/20 that he lives at home with his wife  Psychiatric:        Mood and Affect: Mood and affect normal.     ED Results / Procedures / Treatments   Labs (all labs ordered are listed, but only abnormal results are displayed) Labs Reviewed - No data to display  EKG None  Radiology CT Head Wo Contrast  Result Date: 10/03/2020 CLINICAL DATA:  Tripped fall and ground level EXAM: CT HEAD WITHOUT  CONTRAST TECHNIQUE: Contiguous axial images were obtained from the base of the skull through the vertex without intravenous contrast. COMPARISON:  None. FINDINGS: Brain: No evidence of acute  territorial infarction, hemorrhage, hydrocephalus,extra-axial collection or mass lesion/mass effect. There is dilatation the ventricles and sulci consistent with age-related atrophy. Low-attenuation changes in the deep white matter consistent with small vessel ischemia. Areas of encephalomalacia involving the right frontal lobe, right occipital lobe and bilateral cerebellum. Vascular: No hyperdense vessel or unexpected calcification. Skull: The skull is intact. No fracture or focal lesion identified. Sinuses/Orbits: The visualized paranasal sinuses and mastoid air cells are clear. The orbits and globes intact. Other: None Cervical spine: Alignment: Physiologic Skull base and vertebrae: Visualized skull base is intact. No atlanto-occipital dissociation. The vertebral body heights are well maintained. No fracture or pathologic osseous lesion seen. Soft tissues and spinal canal: The visualized paraspinal soft tissues are unremarkable. No prevertebral soft tissue swelling is seen. The spinal canal is grossly unremarkable, no large epidural collection or significant canal narrowing. Disc levels: Multilevel cervical spine spondylosis seen with disc osteophyte complex and uncovertebral osteophytes most notable C5-C6 and C6-C7 with disc osteophyte complex uncovertebral osteophytes causing severe neural foraminal narrowing and mild central canal stenosis. Upper chest: The lung apices are clear. Thoracic inlet is within normal limits. Other: None IMPRESSION: No acute intracranial abnormality. Findings consistent with age related atrophy and chronic small vessel ischemia Stable areas of encephalomalacia as described above No acute fracture or malalignment of the spine. Electronically Signed   By: Jonna Clark M.D.   On: 10/03/2020 20:34    CT Cervical Spine Wo Contrast  Result Date: 10/03/2020 CLINICAL DATA:  Tripped fall and ground level EXAM: CT HEAD WITHOUT CONTRAST TECHNIQUE: Contiguous axial images were obtained from the base of the skull through the vertex without intravenous contrast. COMPARISON:  None. FINDINGS: Brain: No evidence of acute territorial infarction, hemorrhage, hydrocephalus,extra-axial collection or mass lesion/mass effect. There is dilatation the ventricles and sulci consistent with age-related atrophy. Low-attenuation changes in the deep white matter consistent with small vessel ischemia. Areas of encephalomalacia involving the right frontal lobe, right occipital lobe and bilateral cerebellum. Vascular: No hyperdense vessel or unexpected calcification. Skull: The skull is intact. No fracture or focal lesion identified. Sinuses/Orbits: The visualized paranasal sinuses and mastoid air cells are clear. The orbits and globes intact. Other: None Cervical spine: Alignment: Physiologic Skull base and vertebrae: Visualized skull base is intact. No atlanto-occipital dissociation. The vertebral body heights are well maintained. No fracture or pathologic osseous lesion seen. Soft tissues and spinal canal: The visualized paraspinal soft tissues are unremarkable. No prevertebral soft tissue swelling is seen. The spinal canal is grossly unremarkable, no large epidural collection or significant canal narrowing. Disc levels: Multilevel cervical spine spondylosis seen with disc osteophyte complex and uncovertebral osteophytes most notable C5-C6 and C6-C7 with disc osteophyte complex uncovertebral osteophytes causing severe neural foraminal narrowing and mild central canal stenosis. Upper chest: The lung apices are clear. Thoracic inlet is within normal limits. Other: None IMPRESSION: No acute intracranial abnormality. Findings consistent with age related atrophy and chronic small vessel ischemia Stable areas of encephalomalacia as  described above No acute fracture or malalignment of the spine. Electronically Signed   By: Jonna Clark M.D.   On: 10/03/2020 20:34    Procedures Procedures   Medications Ordered in ED Medications - No data to display  ED Course  I have reviewed the triage vital signs and the nursing notes.  Pertinent labs & imaging results that were available during my care of the patient were reviewed by me and considered in my medical decision making (see chart for details).    MDM Rules/Calculators/A&P  Stancil Deisher is an 87 year old male who presents to the ED after fall.  Patient tripped and fell and hit the back of his head.  No obvious hematoma on exam.  Not on blood thinners.  Patient with dementia.  Not complaining of any pain.  Neurologically appears intact.  He is ambulatory.  CT scan of his head and neck are unremarkable.  Discharged back to facility.  Not having any extremity pain.  This chart was dictated using voice recognition software.  Despite best efforts to proofread,  errors can occur which can change the documentation meaning.    Final Clinical Impression(s) / ED Diagnoses Final diagnoses:  Fall, initial encounter    Rx / DC Orders ED Discharge Orders    None       Virgina Norfolk, DO 10/03/20 2039

## 2020-10-03 NOTE — ED Notes (Signed)
[facility] called, pick up requested

## 2020-10-03 NOTE — ED Notes (Signed)
Per wife, patient will have to go back to facility via [facility]-if patient wants to call her that would be fine

## 2020-12-21 ENCOUNTER — Encounter (HOSPITAL_COMMUNITY): Payer: Self-pay | Admitting: Emergency Medicine

## 2020-12-21 ENCOUNTER — Other Ambulatory Visit: Payer: Self-pay

## 2020-12-21 ENCOUNTER — Emergency Department (HOSPITAL_COMMUNITY): Payer: Medicare Other

## 2020-12-21 ENCOUNTER — Emergency Department (HOSPITAL_COMMUNITY)
Admission: EM | Admit: 2020-12-21 | Discharge: 2020-12-22 | Disposition: A | Discharge status: Skilled Nursing Facility | Payer: Medicare Other | Attending: Emergency Medicine | Admitting: Emergency Medicine

## 2020-12-21 DIAGNOSIS — E1140 Type 2 diabetes mellitus with diabetic neuropathy, unspecified: Secondary | ICD-10-CM | POA: Diagnosis not present

## 2020-12-21 DIAGNOSIS — R42 Dizziness and giddiness: Secondary | ICD-10-CM | POA: Diagnosis not present

## 2020-12-21 DIAGNOSIS — Z79899 Other long term (current) drug therapy: Secondary | ICD-10-CM | POA: Insufficient documentation

## 2020-12-21 DIAGNOSIS — R110 Nausea: Secondary | ICD-10-CM | POA: Insufficient documentation

## 2020-12-21 DIAGNOSIS — R072 Precordial pain: Secondary | ICD-10-CM

## 2020-12-21 DIAGNOSIS — F0391 Unspecified dementia with behavioral disturbance: Secondary | ICD-10-CM | POA: Insufficient documentation

## 2020-12-21 DIAGNOSIS — I2510 Atherosclerotic heart disease of native coronary artery without angina pectoris: Secondary | ICD-10-CM | POA: Insufficient documentation

## 2020-12-21 DIAGNOSIS — R0682 Tachypnea, not elsewhere classified: Secondary | ICD-10-CM | POA: Insufficient documentation

## 2020-12-21 LAB — CBC WITH DIFFERENTIAL/PLATELET
Abs Immature Granulocytes: 0.04 10*3/uL (ref 0.00–0.07)
Basophils Absolute: 0 10*3/uL (ref 0.0–0.1)
Basophils Relative: 0 %
Eosinophils Absolute: 0 10*3/uL (ref 0.0–0.5)
Eosinophils Relative: 0 %
HCT: 41.2 % (ref 39.0–52.0)
Hemoglobin: 13.4 g/dL (ref 13.0–17.0)
Immature Granulocytes: 0 %
Lymphocytes Relative: 6 %
Lymphs Abs: 0.6 10*3/uL — ABNORMAL LOW (ref 0.7–4.0)
MCH: 32.9 pg (ref 26.0–34.0)
MCHC: 32.5 g/dL (ref 30.0–36.0)
MCV: 101.2 fL — ABNORMAL HIGH (ref 80.0–100.0)
Monocytes Absolute: 0.4 10*3/uL (ref 0.1–1.0)
Monocytes Relative: 4 %
Neutro Abs: 9 10*3/uL — ABNORMAL HIGH (ref 1.7–7.7)
Neutrophils Relative %: 90 %
Platelets: 127 10*3/uL — ABNORMAL LOW (ref 150–400)
RBC: 4.07 MIL/uL — ABNORMAL LOW (ref 4.22–5.81)
RDW: 13.3 % (ref 11.5–15.5)
WBC: 10 10*3/uL (ref 4.0–10.5)
nRBC: 0 % (ref 0.0–0.2)

## 2020-12-21 LAB — COMPREHENSIVE METABOLIC PANEL WITH GFR
ALT: 8 U/L (ref 0–44)
AST: 19 U/L (ref 15–41)
Albumin: 3.3 g/dL — ABNORMAL LOW (ref 3.5–5.0)
Alkaline Phosphatase: 76 U/L (ref 38–126)
Anion gap: 8 (ref 5–15)
BUN: 18 mg/dL (ref 8–23)
CO2: 25 mmol/L (ref 22–32)
Calcium: 8.1 mg/dL — ABNORMAL LOW (ref 8.9–10.3)
Chloride: 109 mmol/L (ref 98–111)
Creatinine, Ser: 1.09 mg/dL (ref 0.61–1.24)
GFR, Estimated: >60 mL/min
Glucose, Bld: 212 mg/dL — ABNORMAL HIGH (ref 70–99)
Potassium: 4.3 mmol/L (ref 3.5–5.1)
Sodium: 142 mmol/L (ref 135–145)
Total Bilirubin: 0.7 mg/dL (ref 0.3–1.2)
Total Protein: 5.4 g/dL — ABNORMAL LOW (ref 6.5–8.1)

## 2020-12-21 LAB — TROPONIN I (HIGH SENSITIVITY): Troponin I (High Sensitivity): 9 ng/L (ref <18)

## 2020-12-21 NOTE — ED Provider Notes (Signed)
[facility] EMERGENCY DEPARTMENT Provider Note   CSN: [id number] Arrival date & time: 12/21/20  2052     History Chief Complaint  Patient presents with  . Chest Pain    Kevin [name] is a 87 y.o. male with a history of dementia, CAD, diabetes mellitus.  Patient is alert to person only, this is his baseline per SNF and wife.  Level 5 caveat applies.    Patient presents with chief plaint of chest pain.  [facility] reports that at 1730 patient complained of chest pain, nausea, and dizziness.  Due to these complaints EMS was called and patient was brought to the emergency department.  Patient received 324 mg of aspirin and 1 sublingual nitro with EMS.  When asked patient does not know why he was brought to the hospital.  Patient denies any complaints at this time.   HPI     Past Medical History:  Diagnosis Date  . BPH (benign prostatic hyperplasia)   . Coronary artery disease   . Dementia (HCC)   . Diabetes mellitus without complication (HCC)   . Dizziness   . Dyspnea   . GERD (gastroesophageal reflux disease)   . Gout   . Hypotension   . Neuropathy   . Thyroid disease     Patient Active Problem List   Diagnosis Date Noted  . Dementia with behavioral disturbance (HCC) 06/15/2019    History reviewed. No pertinent surgical history.     No family history on file.  Social History   Tobacco Use  . Smoking status: Unknown If Ever Smoked  . Smokeless tobacco: Never Used  Vaping Use  . Vaping Use: Never used  Substance Use Topics  . Alcohol use: Not Currently  . Drug use: Not Currently    Home Medications Prior to Admission medications   Medication Sig Start Date End Date Taking? Authorizing Provider  acetaminophen (TYLENOL) 500 MG tablet Take 500 mg by mouth every 4 (four) hours as needed for moderate pain.    [provider]  allopurinol (ZYLOPRIM) 100 MG tablet Take 100 mg by mouth daily. 01/25/19    [provider]  amLODipine (NORVASC) 2.5 MG tablet Take 2.5 mg by mouth daily. 03/25/19   [provider]  divalproex (DEPAKOTE) 125 MG DR tablet Take 125 mg by mouth 2 (two) times daily.    [provider]  Ensure (ENSURE) Take 237 mLs by mouth 3 (three) times daily between meals.    [provider]  LORazepam (ATIVAN) 0.5 MG tablet Take 0.5 mg by mouth 2 (two) times daily. 05/14/19   [provider]  LORazepam (ATIVAN) 1 MG tablet Take 1 mg by mouth 2 (two) times daily as needed for anxiety.    [provider]  magnesium oxide-pyridoxine (BEELITH) 362-20 MG TABS tablet Take 1 tablet by mouth daily.    [provider]  memantine (NAMENDA) 10 MG tablet Take 10 mg by mouth 2 (two) times daily. 03/25/19   [provider]  Menthol-Methyl Salicylate (ICY HOT EXTRA STRENGTH) 10-30 % CREA Apply 1 application topically every 6 (six) hours as needed (for Pain).    [provider]  OLANZapine (ZYPREXA) 5 MG tablet Take 5 mg by mouth daily.    [provider]  sertraline (ZOLOFT) 50 MG tablet Take 50 mg by mouth daily.    [provider]  traZODone (DESYREL) 100 MG tablet Take 100 mg by mouth at bedtime as needed for sleep.  [provider]  vitamin B-12 (CYANOCOBALAMIN) 1000 MCG tablet Take 1,000 mcg by mouth daily.    [provider]    Allergies    Patient has no known allergies.  Review of Systems   Review of Systems  Unable to perform ROS: Dementia    Physical Exam Updated Vital Signs BP (!) 148/79   Pulse 82   Temp 98.4 F (36.9 C) (Oral)   Resp (!) 24   Ht 6\' 1"  (1.854 m)   Wt 95.3 kg   SpO2 98%   BMI 27.71 kg/m   Physical Exam Vitals and nursing note reviewed.  Constitutional:      General: He is not in acute distress.    Appearance: He is not ill-appearing, toxic-appearing or diaphoretic.  HENT:     Head: Normocephalic.  Eyes:     General: No scleral icterus.        Right eye: No discharge.        Left eye: No discharge.  Cardiovascular:     Rate and Rhythm: Normal rate.     Heart sounds: Normal heart sounds.  Pulmonary:     Effort: Pulmonary effort is normal. Tachypnea present. No bradypnea or respiratory distress.     Breath sounds: Normal breath sounds. No stridor.     Comments: Patient able to speak in full complete sentences without difficulty.  Oxygen saturation 98% on room air  Tachypnea at rate of 24 Abdominal:     General: There is no distension. There are no signs of injury.     Palpations: Abdomen is soft. There is no mass or pulsatile mass.     Tenderness: There is no abdominal tenderness. There is no guarding or rebound.     Hernia: There is no hernia in the umbilical area or ventral area.  Musculoskeletal:     Cervical back: Normal range of motion and neck supple.     Right lower leg: No tenderness. No edema.     Left lower leg: No tenderness. No edema.  Skin:    General: Skin is warm and dry.     Coloration: Skin is not cyanotic or pale.  Neurological:     General: No focal deficit present.     Mental Status: He is alert. Mental status is at baseline.     Comments: Alert to person only  Psychiatric:        Behavior: Behavior is cooperative.     ED Results / Procedures / Treatments   Labs (all labs ordered are listed, but only abnormal results are displayed) Labs Reviewed  COMPREHENSIVE METABOLIC PANEL - Abnormal; Notable for the following components:      Result Value   Glucose, Bld 212 (*)    Calcium 8.1 (*)    Total Protein 5.4 (*)    Albumin 3.3 (*)    All other components within normal limits  CBC WITH DIFFERENTIAL/PLATELET - Abnormal; Notable for the following components:   RBC 4.07 (*)    MCV 101.2 (*)    Platelets 127 (*)    Neutro Abs 9.0 (*)    Lymphs Abs 0.6 (*)    All other components within normal limits  TROPONIN I (HIGH SENSITIVITY)  TROPONIN I (HIGH SENSITIVITY)    EKG EKG  Interpretation  Date/Time:  Thursday December 21 2020 21:03:35 EDT Ventricular Rate:  83 PR Interval:  238 QRS Duration: 95 QT Interval:  414 QTC Calculation: 417 R Axis:   -27 Text Interpretation: Sinus or  ectopic atrial rhythm Paired ventricular premature complexes Prolonged PR interval Borderline left axis deviation Abnormal R-wave progression, early transition Consider anterior infarct Abnormal T, consider ischemia, lateral leads No significant change since last tracing Confirmed by Alvira Monday ([id number]) on 12/21/2020 9:22:42 PM   Radiology DG Chest Port 1 View  Result Date: 12/21/2020 CLINICAL DATA:  Chest pain EXAM: PORTABLE CHEST 1 VIEW COMPARISON:  06/15/2019 FINDINGS: Large hiatal hernia is again seen with increasing gaseous distension of the intrathoracic stomach. Mild left basilar atelectasis. Lungs are otherwise clear. No pneumothorax or pleural effusion. Mild cardiomegaly is stable. Posttraumatic changes are again noted within the distal right clavicle. No acute bone abnormality. IMPRESSION: Large hiatal hernia with mildly increased gaseous distension of the intrathoracic stomach. Electronically Signed   By: Helyn Numbers MD   On: 12/21/2020 22:02    Procedures Procedures   Medications Ordered in ED Medications - No data to display  ED Course  I have reviewed the triage vital signs and the nursing notes.  Pertinent labs & imaging results that were available during my care of the patient were reviewed by me and considered in my medical decision making (see chart for details).    MDM Rules/Calculators/A&P                          87 year old male in no acute distress, nontoxic-appearing.  Patient presents with reported complaint of chest pain.  Patient has history of dementia and is unable to give any history.  Speaking with SNF representative patient complained of chest pain, dizziness, and nausea at 1530.  Patient received 324 mg of aspirin and 1 sublingual nitro with  EMS.  Triage note patient had oxygen saturation of 87 on room air.    SNF representative and patient's wife report patient baseline mental status is alert to person only.  On physical exam patient is noted to have oxygen saturation at 98% on room air.  Patient able to speak in full complete sentences without difficulty.  Lungs clear to auscultation bilaterally.  Patient denies any complaints or discomfort.    Due to reported chest pain will order EKG, chest x-ray, CMP, CBC, troponin.  Chest x-ray shows no acute cardiopulmonary disease.  Large hiatal hernia with mildly increased gaseous distention of the intrathoracic stomach observed.  SPer chart review patient was noted to have a large hiatal hernia on 06/18/2019.  EKG shows Sinus or ectopic atrial rhythm Paired ventricular premature complexes; no significant change since previous tracing.  CBC shows platelets decreased at 127.  Appears to be patient's baseline  CMP shows glucose at 212, calcium, total protein, and albumin all slightly decreased.  All other values within normal limits.  Troponin 9.  Will get delta troponin.  If negative discharge patient back to SNF facility.  If patient needs to be admitted his wife would like to be contacted.    Patient was discussed with and evaluated by Dr. Dalene Seltzer.  Patient care transferred to PA Sanders at the end of my shift. Patient presentation, ED course, and plan of care discussed with review of all pertinent labs and imaging. Please see his/her note for further details regarding further ED course and disposition.   Final Clinical Impression(s) / ED Diagnoses Final diagnoses:  Precordial chest pain    Rx / DC Orders ED Discharge Orders    None       Haskel Schroeder, PA-C 12/22/20 1031    Alvira Monday, MD 12/22/20 [phone]  Alvira Monday, MD 12/22/20 1536

## 2020-12-21 NOTE — ED Triage Notes (Signed)
Patient with no cardiac history, coming in with chest pain from the [facility].  Patient does have dementia and is at his baseline MS.  Patient had low oxygen sats upon EMS arrival, 87% on RA.  Patient was given 324mg  ASA, one SL nitro and oxygen en route to ED.  Pain is now gone, oxygen sat is in high 90s.

## 2020-12-22 LAB — TROPONIN I (HIGH SENSITIVITY): Troponin I (High Sensitivity): 15 ng/L (ref <18)

## 2020-12-22 NOTE — ED Provider Notes (Signed)
  Assumed care from PA Badalamente at shift change.  See prior notes for full H&P.  Briefly, 87 y.o. M here with reported chest pain.  Denied this here in ED and was unsure why he was here.  Initial labs reassuring.    Plan:  Delta trop pending.  If negative, will discharge back to facility.  Results for orders placed or performed during the hospital encounter of 12/21/20  Comprehensive metabolic panel  Result Value Ref Range   Sodium 142 135 - 145 mmol/L   Potassium 4.3 3.5 - 5.1 mmol/L   Chloride 109 98 - 111 mmol/L   CO2 25 22 - 32 mmol/L   Glucose, Bld 212 (H) 70 - 99 mg/dL   BUN 18 8 - 23 mg/dL   Creatinine, Ser 9.83 0.61 - 1.24 mg/dL   Calcium 8.1 (L) 8.9 - 10.3 mg/dL   Total Protein 5.4 (L) 6.5 - 8.1 g/dL   Albumin 3.3 (L) 3.5 - 5.0 g/dL   AST 19 15 - 41 U/L   ALT 8 0 - 44 U/L   Alkaline Phosphatase 76 38 - 126 U/L   Total Bilirubin 0.7 0.3 - 1.2 mg/dL   GFR, Estimated >38 >25 mL/min   Anion gap 8 5 - 15  CBC with Differential  Result Value Ref Range   WBC 10.0 4.0 - 10.5 K/uL   RBC 4.07 (L) 4.22 - 5.81 MIL/uL   Hemoglobin 13.4 13.0 - 17.0 g/dL   HCT 05.3 97.6 - 73.4 %   MCV 101.2 (H) 80.0 - 100.0 fL   MCH 32.9 26.0 - 34.0 pg   MCHC 32.5 30.0 - 36.0 g/dL   RDW 19.3 79.0 - 24.0 %   Platelets 127 (L) 150 - 400 K/uL   nRBC 0.0 0.0 - 0.2 %   Neutrophils Relative % 90 %   Neutro Abs 9.0 (H) 1.7 - 7.7 K/uL   Lymphocytes Relative 6 %   Lymphs Abs 0.6 (L) 0.7 - 4.0 K/uL   Monocytes Relative 4 %   Monocytes Absolute 0.4 0.1 - 1.0 K/uL   Eosinophils Relative 0 %   Eosinophils Absolute 0.0 0.0 - 0.5 K/uL   Basophils Relative 0 %   Basophils Absolute 0.0 0.0 - 0.1 K/uL   Immature Granulocytes 0 %   Abs Immature Granulocytes 0.04 0.00 - 0.07 K/uL  Troponin I (High Sensitivity)  Result Value Ref Range   Troponin I (High Sensitivity) 9 <18 ng/L  Troponin I (High Sensitivity)  Result Value Ref Range   Troponin I (High Sensitivity) 15 <18 ng/L   DG Chest Port 1  View  Result Date: 12/21/2020 CLINICAL DATA:  Chest pain EXAM: PORTABLE CHEST 1 VIEW COMPARISON:  06/15/2019 FINDINGS: Large hiatal hernia is again seen with increasing gaseous distension of the intrathoracic stomach. Mild left basilar atelectasis. Lungs are otherwise clear. No pneumothorax or pleural effusion. Mild cardiomegaly is stable. Posttraumatic changes are again noted within the distal right clavicle. No acute bone abnormality. IMPRESSION: Large hiatal hernia with mildly increased gaseous distension of the intrathoracic stomach. Electronically Signed   By: Helyn Numbers MD   On: 12/21/2020 22:02    12:51 AM Delta trop negative.  Patient continues to be pleasantly confused without complaints of chest pain on my assessment.  Feel he is stable for discharge back to facility with close PCP follow-up.  Return here for new concerns.   Garlon Hatchet, PA-C 12/22/20 0055    Pollyann Savoy, MD 12/22/20 [phone]

## 2020-12-22 NOTE — ED Notes (Signed)
Report given to [place] at [place] at [place].

## 2020-12-22 NOTE — ED Notes (Signed)
PTAR called  

## 2020-12-22 NOTE — Discharge Instructions (Addendum)
You were brought to the emergency department today due to complaints of chest pain.  Your EKG, lab work, and chest x-ray were reassuring that you are not having an acute heart attack today.  Please follow-up with your primary care provider.  Get help right away if: Your chest pain gets worse. You have a cough that gets worse, or you cough up blood. You have severe pain in your abdomen. You faint. You have sudden, unexplained chest discomfort. You have sudden, unexplained discomfort in your arms, back, neck, or jaw. You have shortness of breath at any time. You suddenly start to sweat, or your skin gets clammy. You feel nausea or you vomit. You suddenly feel lightheaded or dizzy. You have severe weakness, or unexplained weakness or fatigue. Your heart begins to beat quickly, or it feels like it is skipping beats.

## 2021-06-15 ENCOUNTER — Encounter (HOSPITAL_COMMUNITY): Payer: Self-pay | Admitting: Emergency Medicine

## 2021-06-15 ENCOUNTER — Emergency Department (HOSPITAL_COMMUNITY)
Admission: EM | Admit: 2021-06-15 | Discharge: 2021-06-16 | Disposition: A | Discharge status: Home / Self Care | Payer: Medicare Other | Attending: Emergency Medicine | Admitting: Emergency Medicine

## 2021-06-15 ENCOUNTER — Emergency Department (HOSPITAL_COMMUNITY): Payer: Medicare Other

## 2021-06-15 DIAGNOSIS — I2510 Atherosclerotic heart disease of native coronary artery without angina pectoris: Secondary | ICD-10-CM | POA: Insufficient documentation

## 2021-06-15 DIAGNOSIS — E119 Type 2 diabetes mellitus without complications: Secondary | ICD-10-CM | POA: Diagnosis not present

## 2021-06-15 DIAGNOSIS — R112 Nausea with vomiting, unspecified: Secondary | ICD-10-CM | POA: Diagnosis present

## 2021-06-15 DIAGNOSIS — F0390 Unspecified dementia without behavioral disturbance: Secondary | ICD-10-CM | POA: Insufficient documentation

## 2021-06-15 LAB — CBC WITH DIFFERENTIAL/PLATELET
Abs Immature Granulocytes: 0.05 10*3/uL (ref 0.00–0.07)
Basophils Absolute: 0 10*3/uL (ref 0.0–0.1)
Basophils Relative: 0 %
Eosinophils Absolute: 0 10*3/uL (ref 0.0–0.5)
Eosinophils Relative: 0 %
HCT: 44.2 % (ref 39.0–52.0)
Hemoglobin: 14.8 g/dL (ref 13.0–17.0)
Immature Granulocytes: 0 %
Lymphocytes Relative: 6 %
Lymphs Abs: 0.8 10*3/uL (ref 0.7–4.0)
MCH: 33.2 pg (ref 26.0–34.0)
MCHC: 33.5 g/dL (ref 30.0–36.0)
MCV: 99.1 fL (ref 80.0–100.0)
Monocytes Absolute: 0.6 10*3/uL (ref 0.1–1.0)
Monocytes Relative: 4 %
Neutro Abs: 11.7 10*3/uL — ABNORMAL HIGH (ref 1.7–7.7)
Neutrophils Relative %: 90 %
Platelets: 146 10*3/uL — ABNORMAL LOW (ref 150–400)
RBC: 4.46 MIL/uL (ref 4.22–5.81)
RDW: 13.1 % (ref 11.5–15.5)
WBC: 13.2 10*3/uL — ABNORMAL HIGH (ref 4.0–10.5)
nRBC: 0 % (ref 0.0–0.2)

## 2021-06-15 LAB — COMPREHENSIVE METABOLIC PANEL
ALT: 12 U/L (ref 0–44)
ALT: 9 U/L (ref 0–44)
AST: 19 U/L (ref 15–41)
AST: 25 U/L (ref 15–41)
Albumin: 3.9 g/dL (ref 3.5–5.0)
Albumin: 3.9 g/dL (ref 3.5–5.0)
Alkaline Phosphatase: 107 U/L (ref 38–126)
Alkaline Phosphatase: 108 U/L (ref 38–126)
Anion gap: 10 (ref 5–15)
Anion gap: 12 (ref 5–15)
BUN: 25 mg/dL — ABNORMAL HIGH (ref 8–23)
BUN: 27 mg/dL — ABNORMAL HIGH (ref 8–23)
CO2: 25 mmol/L (ref 22–32)
CO2: 23 mmol/L (ref 22–32)
Calcium: 9.3 mg/dL (ref 8.9–10.3)
Calcium: 9.2 mg/dL (ref 8.9–10.3)
Chloride: 107 mmol/L (ref 98–111)
Chloride: 107 mmol/L (ref 98–111)
Creatinine, Ser: 1.18 mg/dL (ref 0.61–1.24)
Creatinine, Ser: 1.16 mg/dL (ref 0.61–1.24)
GFR, Estimated: >60 mL/min (ref >60)
GFR, Estimated: >60 mL/min (ref >60)
Glucose, Bld: 156 mg/dL — ABNORMAL HIGH (ref 70–99)
Glucose, Bld: 158 mg/dL — ABNORMAL HIGH (ref 70–99)
Potassium: 4.4 mmol/L (ref 3.5–5.1)
Potassium: 4.6 mmol/L (ref 3.5–5.1)
Sodium: 142 mmol/L (ref 135–145)
Sodium: 142 mmol/L (ref 135–145)
Total Bilirubin: 0.9 mg/dL (ref 0.3–1.2)
Total Bilirubin: 0.9 mg/dL (ref 0.3–1.2)
Total Protein: 6.4 g/dL — ABNORMAL LOW (ref 6.5–8.1)
Total Protein: 6.6 g/dL (ref 6.5–8.1)

## 2021-06-15 LAB — VALPROIC ACID LEVEL
Valproic Acid Lvl: 11 ug/mL — ABNORMAL LOW (ref 50.0–100.0)
Valproic Acid Lvl: 11 ug/mL — ABNORMAL LOW (ref 50.0–100.0)

## 2021-06-15 MED ORDER — ONDANSETRON 4 MG PO TBDP: 4.0000 mg | ORAL_TABLET | Freq: Three times a day (TID) | ORAL | 0 refills | Status: AC | PRN

## 2021-06-15 NOTE — Discharge Instructions (Signed)
Your testing is unremarkable You have refused xrays You should return if you have worsening symptoms Take one zofran every 6 hours as needed for nausea

## 2021-06-15 NOTE — ED Notes (Signed)
[facility] called, 3rd in line °

## 2021-06-15 NOTE — ED Triage Notes (Signed)
Pt bib EMS from [facility], memory care unit. Pt was eating dinner and lowered self to floor, holding his stomach and calling for help. Several episodes of emesis. Pt has no recollection of this occurring r/t dementia.  HR 40-80, no known cardiac hx 152/82 96% room air  CBG 149

## 2021-06-15 NOTE — ED Provider Notes (Signed)
[name] EMERGENCY DEPARTMENT Provider Note   CSN: [id number] Arrival date & time: 06/15/21  2010     History No chief complaint on file.   Kevin [name] is a 87 y.o. male.  HPI  This patient is a very pleasant 86 year old male with a history of dementia, he has a known diabetic who has a history of coronary disease and is currently living in a nursing facility at [facility].  He is in the memory care unit.  Reportedly the patient was eating dinner when he lowered himself to the floor held his stomach and call for help, there was a couple episodes of vomiting and after that the patient was back to his normal self without any complaints.  He does not know why he is here, he has no complaints of pain and states he is not nauseated has no headache no chest pain no coughing no shortness of breath no pain in his legs no swelling in his legs no diarrhea no dysuria.  A level 5 caveat does apply secondary to dementia.  Paramedics did note that there was some irregularity to his heart rate.  Past Medical History:  Diagnosis Date   BPH (benign prostatic hyperplasia)    Coronary artery disease    Dementia (HCC)    Diabetes mellitus without complication (HCC)    Dizziness    Dyspnea    GERD (gastroesophageal reflux disease)    Gout    Hypotension    Neuropathy    Thyroid disease     Patient Active Problem List   Diagnosis Date Noted   Dementia with behavioral disturbance 06/15/2019    History reviewed. No pertinent surgical history.     History reviewed. No pertinent family history.  Social History   Tobacco Use   Smoking status: Unknown   Smokeless tobacco: Never  Vaping Use   Vaping Use: Never used  Substance Use Topics   Alcohol use: Not Currently   Drug use: Not Currently    Home Medications Prior to Admission medications   Medication Sig Start Date End Date Taking? Authorizing Provider  ondansetron (ZOFRAN ODT) 4 MG disintegrating  tablet Take 1 tablet (4 mg total) by mouth every 8 (eight) hours as needed for nausea. 06/15/21  Yes Eber Hong, MD  acetaminophen (TYLENOL) 325 MG tablet Take 650 mg by mouth daily as needed for mild pain.    [provider]  allopurinol (ZYLOPRIM) 100 MG tablet Take 100 mg by mouth daily. 01/25/19   [provider]  Cholecalciferol (VITAMIN D3) 10 MCG (400 UNIT) CAPS Take 400 Units by mouth daily.    [provider]  divalproex (DEPAKOTE) 125 MG DR tablet Take 125 mg by mouth 2 (two) times daily.    [provider]  famotidine (PEPCID) 20 MG tablet Take 20 mg by mouth daily.    [provider]  fluconazole (DIFLUCAN) 150 MG tablet Take 150 mg by mouth every Friday.    [provider]  melatonin 3 MG TABS tablet Take 3 mg by mouth at bedtime.    [provider]  memantine (NAMENDA) 10 MG tablet Take 10 mg by mouth 2 (two) times daily. 03/25/19   [provider]  Multiple Vitamin (MULTIVITAMIN) tablet Take 1 tablet by mouth daily. Patient not taking: No sig reported    [provider]  Nutritional Supplements (BOOST PO) Take 1 Container by mouth daily. Chocolate--12pm    [provider]  OLANZapine (ZYPREXA) 5 MG  tablet Take 5 mg by mouth daily. 4pm    [provider]  omeprazole (PRILOSEC) 20 MG capsule Take 20 mg by mouth daily. Patient not taking: No sig reported    [provider]  polyethylene glycol powder (GLYCOLAX/MIRALAX) 17 GM/SCOOP powder Take 17 g by mouth every other day.    [provider]  sertraline (ZOLOFT) 100 MG tablet Take 100 mg by mouth daily.    [provider]  Skin Protectants, Misc. (MINERIN CREME EX) Apply 1 application topically See admin instructions. To feet at bedtime    [provider]  traMADol (ULTRAM) 50 MG tablet Take 25 mg by mouth 3 (three) times daily as needed (right knee pain).    [provider]  traZODone  (DESYREL) 100 MG tablet Take 100 mg by mouth at bedtime as needed for sleep. Patient not taking: Reported on 12/21/2020    [provider]  traZODone (DESYREL) 50 MG tablet Take 50 mg by mouth at bedtime.    [provider]  vitamin B-12 (CYANOCOBALAMIN) 1000 MCG tablet Take 1,000 mcg by mouth daily.    [provider]  Zinc Oxide (DESITIN MAXIMUM STRENGTH EX) Apply 1 application topically See admin instructions. To groin area bid prn redness    [provider]    Allergies    Patient has no known allergies.  Review of Systems   Review of Systems  Unable to perform ROS: Dementia   Physical Exam Updated Vital Signs BP (!) 154/109 (BP Location: Right Arm)   Pulse 89   Temp 98.2 F (36.8 C) (Oral)   Resp 18   SpO2 96%   Physical Exam Vitals and nursing note reviewed.  Constitutional:      General: He is not in acute distress.    Appearance: He is well-developed.  HENT:     Head: Normocephalic and atraumatic.     Mouth/Throat:     Pharynx: No oropharyngeal exudate.  Eyes:     General: No scleral icterus.       Right eye: No discharge.        Left eye: No discharge.     Conjunctiva/sclera: Conjunctivae normal.     Pupils: Pupils are equal, round, and reactive to light.  Neck:     Thyroid: No thyromegaly.     Vascular: No JVD.  Cardiovascular:     Rate and Rhythm: Normal rate and regular rhythm.     Heart sounds: Normal heart sounds. No murmur heard.   No friction rub. No gallop.     Comments: Slight irregularity to the rhythm but good strong pulses Pulmonary:     Effort: Pulmonary effort is normal. No respiratory distress.     Breath sounds: Normal breath sounds. No wheezing or rales.  Abdominal:     General: Bowel sounds are normal. There is no distension.     Palpations: Abdomen is soft. There is no mass.     Tenderness: There is no abdominal tenderness.  Musculoskeletal:        General: No tenderness. Normal range of motion.      Cervical back: Normal range of motion and neck supple.  Lymphadenopathy:     Cervical: No cervical adenopathy.  Skin:    General: Skin is warm and dry.     Findings: No erythema or rash.  Neurological:     Mental Status: He is alert.     Coordination: Coordination normal.     Comments: The patient is confused  she does not know where he is, he does know his name and his date of birth, he does not know the circumstances of him being here.  This seems to be baseline  Psychiatric:        Behavior: Behavior normal.    ED Results / Procedures / Treatments   Labs (all labs ordered are listed, but only abnormal results are displayed) Labs Reviewed  COMPREHENSIVE METABOLIC PANEL - Abnormal; Notable for the following components:      Result Value   Glucose, Bld 156 (*)    BUN 25 (*)    Total Protein 6.4 (*)    All other components within normal limits  VALPROIC ACID LEVEL - Abnormal; Notable for the following components:   Valproic Acid Lvl 11 (*)    All other components within normal limits  CBC WITH DIFFERENTIAL/PLATELET - Abnormal; Notable for the following components:   WBC 13.2 (*)    Platelets 146 (*)    Neutro Abs 11.7 (*)    All other components within normal limits  COMPREHENSIVE METABOLIC PANEL - Abnormal; Notable for the following components:   Glucose, Bld 158 (*)    BUN 27 (*)    All other components within normal limits  VALPROIC ACID LEVEL - Abnormal; Notable for the following components:   Valproic Acid Lvl 11 (*)    All other components within normal limits  CBC WITH DIFFERENTIAL/PLATELET  URINALYSIS, ROUTINE W REFLEX MICROSCOPIC    EKG EKG Interpretation  Date/Time:  Friday June 15 2021 20:21:32 EDT Ventricular Rate:  86 PR Interval:  304 QRS Duration: 96 QT Interval:  421 QTC Calculation: 504 R Axis:   -24 Text Interpretation: Sinus or ectopic atrial rhythm Ventricular bigeminy Sinus pause Prolonged PR interval LVH with secondary repolarization  abnormality Anterior infarct, old Prolonged QT interval unchanged from prior Confirmed by Eber Hong ([id number]) on 06/15/2021 8:47:58 PM  Radiology No results found.  Procedures Procedures   Medications Ordered in ED Medications - No data to display  ED Course  I have reviewed the triage vital signs and the nursing notes.  Pertinent labs & imaging results that were available during my care of the patient were reviewed by me and considered in my medical decision making (see chart for details).    MDM Rules/Calculators/A&P                           The patient's EKG shows sinus rhythm with a first-degree AV block, there is occasional PACs and some prolonged repull.  Otherwise unchanged from prior EKGs.  We will obtain an acute abdominal series as well as some lab work to make sure there is nothing else going on however the patient has a totally benign appearance at this time, no signs of ischemia on his EKG or significant arrhythmia.  Pt refused the xrays He has no ttp in the abd and labs are reassuring WBC is minimally elevated but without clinical significance as he has had no vomiting here, no abd pain and no ttp.  Will d/c home.  Final Clinical Impression(s) / ED Diagnoses Final diagnoses:  Nausea and vomiting, unspecified vomiting type    Rx / DC Orders ED Discharge Orders          Ordered    ondansetron (ZOFRAN ODT) 4 MG disintegrating tablet  Every 8 hours PRN        06/15/21 2324  Eber Hong, MD 06/15/21 2325

## 2021-06-16 NOTE — ED Notes (Signed)
Pt discharged and wheeled out of the ED on a stretcher with [facility] crew without difficulties.

## 2021-07-21 ENCOUNTER — Other Ambulatory Visit (HOSPITAL_COMMUNITY): Payer: BLUE CROSS/BLUE SHIELD

## 2021-07-21 ENCOUNTER — Encounter (HOSPITAL_COMMUNITY): Payer: Self-pay | Admitting: *Deleted

## 2021-07-21 ENCOUNTER — Other Ambulatory Visit: Payer: Self-pay

## 2021-07-21 ENCOUNTER — Emergency Department (HOSPITAL_COMMUNITY)
Admission: EM | Admit: 2021-07-21 | Discharge: 2021-07-21 | Disposition: A | Discharge status: Home / Self Care | Payer: Medicare Other | Attending: Emergency Medicine | Admitting: Emergency Medicine

## 2021-07-21 ENCOUNTER — Emergency Department (HOSPITAL_COMMUNITY): Payer: Medicare Other

## 2021-07-21 DIAGNOSIS — F0390 Unspecified dementia without behavioral disturbance: Secondary | ICD-10-CM | POA: Insufficient documentation

## 2021-07-21 DIAGNOSIS — I2510 Atherosclerotic heart disease of native coronary artery without angina pectoris: Secondary | ICD-10-CM | POA: Diagnosis not present

## 2021-07-21 DIAGNOSIS — S42294A Other nondisplaced fracture of upper end of right humerus, initial encounter for closed fracture: Secondary | ICD-10-CM | POA: Diagnosis not present

## 2021-07-21 DIAGNOSIS — S4991XA Unspecified injury of right shoulder and upper arm, initial encounter: Secondary | ICD-10-CM | POA: Diagnosis present

## 2021-07-21 DIAGNOSIS — W19XXXA Unspecified fall, initial encounter: Secondary | ICD-10-CM | POA: Diagnosis not present

## 2021-07-21 DIAGNOSIS — E1140 Type 2 diabetes mellitus with diabetic neuropathy, unspecified: Secondary | ICD-10-CM | POA: Diagnosis not present

## 2021-07-21 LAB — BASIC METABOLIC PANEL
Anion gap: 10 (ref 5–15)
BUN: 22 mg/dL (ref 8–23)
CO2: 26 mmol/L (ref 22–32)
Calcium: 9 mg/dL (ref 8.9–10.3)
Chloride: 104 mmol/L (ref 98–111)
Creatinine, Ser: 1.15 mg/dL (ref 0.61–1.24)
GFR, Estimated: >60 mL/min (ref >60)
Glucose, Bld: 143 mg/dL — ABNORMAL HIGH (ref 70–99)
Potassium: 4.4 mmol/L (ref 3.5–5.1)
Sodium: 140 mmol/L (ref 135–145)

## 2021-07-21 LAB — MAGNESIUM: Magnesium: 2.3 mg/dL (ref 1.7–2.4)

## 2021-07-21 LAB — CBG MONITORING, ED: Glucose-Capillary: 107 mg/dL — ABNORMAL HIGH (ref 70–99)

## 2021-07-21 MED ORDER — HYDROCODONE-ACETAMINOPHEN 5-325 MG PO TABS
1.0000 | ORAL_TABLET | Freq: Once | ORAL | Status: AC
  Administered 2021-07-21: 1 via ORAL
  Filled 2021-07-21: qty 1

## 2021-07-21 MED ORDER — HYDROCODONE-ACETAMINOPHEN 5-325 MG PO TABS
1.0000 | ORAL_TABLET | Freq: Once | ORAL | Status: AC
Start: 2021-07-21 — End: 2021-07-21
  Administered 2021-07-21: 1 via ORAL
  Filled 2021-07-21: qty 1

## 2021-07-21 NOTE — Discharge Instructions (Addendum)
You were evaluated in the Emergency Department and after careful evaluation, we did not find any emergent condition requiring admission or further testing in the hospital.  Your exam/testing today was overall reassuring.  Your x-ray has shown a broken humerus bone in your shoulder.  Please follow-up with the orthopedic specialist.  Use Tylenol at home for pain.  Please return to the Emergency Department if you experience any worsening of your condition.  Thank you for allowing [place] to be a part of your care.

## 2021-07-21 NOTE — ED Notes (Signed)
PTAR called at 7:53, 3 rides ahead

## 2021-07-21 NOTE — ED Notes (Signed)
The pt has a strong radial  pulse in the rt arm  alert  he knows hes in the hospital oriented to self

## 2021-07-21 NOTE — ED Triage Notes (Signed)
The pt arrived by [facility] from [facility] care unit.  The pt was walking down the hallway there another pt saw  him walking down the hall lost his balance and fell onto his rt shoulder past history of af pulse irregular on arrival  hx of dementia

## 2021-07-21 NOTE — Progress Notes (Signed)
Orthopedic Tech Progress Note Patient Details:  Kevin [name] [birth year] [id number]  Ortho Devices Type of Ortho Device: Sling immobilizer, Sling and swathe Ortho Device/Splint Location: right Ortho Device/Splint Interventions: Application   Post Interventions Patient Tolerated: Well Instructions Provided: Care of device, Adjustment of device  Saul Fordyce 07/21/2021, 6:43 AM

## 2021-07-21 NOTE — ED Provider Notes (Signed)
[facility]-EMERGENCY DEPT [facility] Emergency Department Provider Note MRN:  [id number]  Arrival date & time: 07/21/21     Chief Complaint   Fall   History of Present Illness   Kevin [name] is a 87 y.o. year-old male with a history of dementia, diabetes presenting to the ED with chief complaint of fall.  Witnessed mechanical fall onto right shoulder.  Endorsing isolated shoulder pain.  Did not hit his head, no neck pain, no headache, no chest pain or shortness of breath, no abdominal pain, no injuries to the legs.  I was unable to obtain an accurate HPI, PMH, or ROS due to the patient's dementia.  Level 5 caveat.  Review of Systems  A complete 10 system review of systems was obtained and all systems are negative except as noted in the HPI and PMH.  Positive for fall.  Patient's Health History    Past Medical History:  Diagnosis Date   BPH (benign prostatic hyperplasia)    Coronary artery disease    Dementia (HCC)    Diabetes mellitus without complication (HCC)    Dizziness    Dyspnea    GERD (gastroesophageal reflux disease)    Gout    Hypotension    Neuropathy    Thyroid disease     History reviewed. No pertinent surgical history.  No family history on file.  Social History   Socioeconomic History   Marital status: Married    Spouse name: Not on file   Number of children: Not on file   Years of education: Not on file   Highest education level: Not on file  Occupational History   Not on file  Tobacco Use   Smoking status: Unknown   Smokeless tobacco: Never  Vaping Use   Vaping Use: Never used  Substance and Sexual Activity   Alcohol use: Not Currently   Drug use: Not Currently   Sexual activity: Not on file  Other Topics Concern   Not on file  Social History Narrative   Not on file   Social Determinants of Health   Financial Resource Strain: Not on file  Food Insecurity: Not on file  Transportation Needs: Not on file  Physical Activity: Not on  file  Stress: Not on file  Social Connections: Not on file  Intimate Partner Violence: Not on file     Physical Exam   Vitals:   07/21/21 0415 07/21/21 0522  BP: 129/66 130/72  Pulse: 94 (!) 44  Resp: 19 16  Temp:    SpO2: 94% 95%    CONSTITUTIONAL: Well-appearing, NAD NEURO:  Alert and oriented x 3, no focal deficits EYES:  eyes equal and reactive ENT/NECK:  no LAD, no JVD CARDIO: Regular rate, well-perfused, normal S1 and S2 PULM:  CTAB no wheezing or rhonchi GI/GU:  normal bowel sounds, non-distended, non-tender MSK/SPINE:  No gross deformities, no edema SKIN:  no rash, atraumatic PSYCH:  Appropriate speech and behavior  *Additional and/or pertinent findings included in MDM below  Diagnostic and Interventional Summary    EKG Interpretation  Date/Time:  Saturday July 21 2021 04:00:05 EST Ventricular Rate:  38 PR Interval:  359 QRS Duration: 96 QT Interval:  517 QTC Calculation: 411 R Axis:   -32 Text Interpretation: Sinus bradycardia Prolonged PR interval Left axis deviation Anterior infarct, old Confirmed by Kennis Carina [phone]) on 07/21/2021 4:47:12 AM       Labs Reviewed  BASIC METABOLIC PANEL - Abnormal; Notable for the following components:  Result Value   Glucose, Bld 143 (*)    All other components within normal limits  CBG MONITORING, ED - Abnormal; Notable for the following components:   Glucose-Capillary 107 (*)    All other components within normal limits  MAGNESIUM  CBC    DG Shoulder Right  Final Result      Medications  HYDROcodone-acetaminophen (NORCO/VICODIN) 5-325 MG per tablet 1 tablet (1 tablet Oral Given 07/21/21 0609)     Procedures  /  Critical Care Procedures  ED Course and Medical Decision Making  I have reviewed the triage vital signs, the nursing notes, and pertinent available records from the EMR.  Listed above are laboratory and imaging tests that I personally ordered, reviewed, and interpreted and then  considered in my medical decision making (see below for details).  Mechanical fall, suspect proximal humerus fracture, awaiting x-ray.  Patient also with irregular heart rhythm, EKG demonstrating trigeminy.  Will obtain screening labs to evaluate for electrolytes.  Not having any chest pain.     Labs are reassuring, patient more comfortable in a sling.  He remains alert and at his neurological baseline, there is no evidence of head trauma.  No anticoagulation.  CT head was initially considered but felt to be unnecessary at this time.  Patient is appropriate for discharge.  Elmer Sow. Pilar Plate, MD [facility] Emergency Medicine [facility] [email or web address]  Final Clinical Impressions(s) / ED Diagnoses     ICD-10-CM   1. Fall, initial encounter  W19.XXXA     2. Other closed nondisplaced fracture of proximal end of right humerus, initial encounter  S42.294A       ED Discharge Orders     None        Discharge Instructions Discussed with and Provided to Patient:     Discharge Instructions      You were evaluated in the Emergency Department and after careful evaluation, we did not find any emergent condition requiring admission or further testing in the hospital.  Your exam/testing today was overall reassuring.  Your x-ray has shown a broken humerus bone in your shoulder.  Please follow-up with the orthopedic specialist.  Use Tylenol at home for pain.  Please return to the Emergency Department if you experience any worsening of your condition.  Thank you for allowing [place] to be a part of your care.         Sabas Sous, MD 07/21/21 [phone]

## 2021-07-21 NOTE — ED Notes (Signed)
Swelling rt shoulder upper humerus

## 2021-07-24 ENCOUNTER — Encounter (HOSPITAL_COMMUNITY): Payer: Self-pay

## 2021-07-24 ENCOUNTER — Emergency Department (HOSPITAL_COMMUNITY): Payer: Medicare Other

## 2021-07-24 ENCOUNTER — Emergency Department (HOSPITAL_COMMUNITY)
Admission: EM | Admit: 2021-07-24 | Discharge: 2021-07-24 | Disposition: A | Discharge status: Home / Self Care | Payer: Medicare Other | Attending: Emergency Medicine | Admitting: Emergency Medicine

## 2021-07-24 ENCOUNTER — Other Ambulatory Visit: Payer: Self-pay

## 2021-07-24 DIAGNOSIS — W19XXXA Unspecified fall, initial encounter: Secondary | ICD-10-CM | POA: Insufficient documentation

## 2021-07-24 DIAGNOSIS — I119 Hypertensive heart disease without heart failure: Secondary | ICD-10-CM | POA: Insufficient documentation

## 2021-07-24 DIAGNOSIS — E119 Type 2 diabetes mellitus without complications: Secondary | ICD-10-CM | POA: Diagnosis not present

## 2021-07-24 DIAGNOSIS — R42 Dizziness and giddiness: Secondary | ICD-10-CM | POA: Insufficient documentation

## 2021-07-24 DIAGNOSIS — F03918 Unspecified dementia, unspecified severity, with other behavioral disturbance: Secondary | ICD-10-CM | POA: Diagnosis not present

## 2021-07-24 DIAGNOSIS — S61411A Laceration without foreign body of right hand, initial encounter: Secondary | ICD-10-CM | POA: Diagnosis not present

## 2021-07-24 DIAGNOSIS — S42201D Unspecified fracture of upper end of right humerus, subsequent encounter for fracture with routine healing: Secondary | ICD-10-CM | POA: Diagnosis not present

## 2021-07-24 DIAGNOSIS — I2510 Atherosclerotic heart disease of native coronary artery without angina pectoris: Secondary | ICD-10-CM | POA: Insufficient documentation

## 2021-07-24 DIAGNOSIS — Z79899 Other long term (current) drug therapy: Secondary | ICD-10-CM | POA: Insufficient documentation

## 2021-07-24 DIAGNOSIS — S4991XD Unspecified injury of right shoulder and upper arm, subsequent encounter: Secondary | ICD-10-CM | POA: Diagnosis present

## 2021-07-24 MED ORDER — LORAZEPAM 2 MG/ML IJ SOLN: 1.0000 mg | Freq: Once | INTRAMUSCULAR | Status: DC

## 2021-07-24 MED ORDER — LORAZEPAM 2 MG/ML IJ SOLN
1.0000 mg | Freq: Once | INTRAMUSCULAR | Status: AC
  Administered 2021-07-24: 1 mg via INTRAMUSCULAR

## 2021-07-24 NOTE — ED Notes (Signed)
PA at bedside, pt behaviors concern for medication needs.

## 2021-07-24 NOTE — ED Triage Notes (Signed)
Pt BIB ems from [facility] at [facility] or an unwitnessed fall that the staff stated they heard. No LOC. Skin tear to the right hand noticed. Preevious fall last week caused a right arm fracture, facility concerned about more injury   97% RA  118/60 84 HR  Altered mental status at baseline

## 2021-07-24 NOTE — ED Provider Notes (Signed)
[facility] EMERGENCY DEPARTMENT Provider Note   CSN: [id number] Arrival date & time: 07/24/21  0544     History Chief Complaint  Patient presents with   Marletta Lor    Kevin [name] is a 87 y.o. male.  87 year old male brought in by EMS from memory care facility with history of DM, dementia, CAD, additional history as listed below, with report of fall. Per EMS, staff heard patient fall and found him lying on his right side, awake, baseline mental status. Concern for recent right humerus fracture from fall now with fall on his right side. Also skin tear to dorsum of right hand. No other obvious injuries. Not anticoagulated. Patient denies any complaints, is a poor historian, level 5 caveat applies.      Past Medical History:  Diagnosis Date   BPH (benign prostatic hyperplasia)    Coronary artery disease    Dementia (HCC)    Diabetes mellitus without complication (HCC)    Dizziness    Dyspnea    GERD (gastroesophageal reflux disease)    Gout    Hypotension    Neuropathy    Thyroid disease     Patient Active Problem List   Diagnosis Date Noted   Dementia with behavioral disturbance 06/15/2019    History reviewed. No pertinent surgical history.     History reviewed. No pertinent family history.  Social History   Tobacco Use   Smoking status: Unknown   Smokeless tobacco: Never  Vaping Use   Vaping Use: Never used  Substance Use Topics   Alcohol use: Not Currently   Drug use: Not Currently    Home Medications Prior to Admission medications   Medication Sig Start Date End Date Taking? Authorizing Provider  acetaminophen (TYLENOL) 325 MG tablet Take 650 mg by mouth daily as needed for mild pain.   Yes [provider]  allopurinol (ZYLOPRIM) 100 MG tablet Take 100 mg by mouth daily. 01/25/19  Yes [provider]  Cholecalciferol (VITAMIN D3) 10 MCG (400 UNIT) CAPS Take 400 Units by mouth daily.   Yes [provider]   famotidine (PEPCID) 20 MG tablet Take 20 mg by mouth daily.   Yes [provider]  fluconazole (DIFLUCAN) 150 MG tablet Take 150 mg by mouth every Friday.   Yes [provider]  melatonin 3 MG TABS tablet Take 3 mg by mouth at bedtime.   Yes [provider]  memantine (NAMENDA) 10 MG tablet Take 10 mg by mouth 2 (two) times daily. 03/25/19  Yes [provider]  Nutritional Supplements (BOOST PO) Take 1 Container by mouth daily at 2 PM. Chocolate-   Yes [provider]  OLANZapine (ZYPREXA) 7.5 MG tablet Take 7.5 mg by mouth at bedtime. 07/04/21  Yes [provider]  ondansetron (ZOFRAN ODT) 4 MG disintegrating tablet Take 1 tablet (4 mg total) by mouth every 8 (eight) hours as needed for nausea. 06/15/21  Yes Eber Hong, MD  polyethylene glycol powder (GLYCOLAX/MIRALAX) 17 GM/SCOOP powder Take 17 g by mouth every other day.   Yes [provider]  sertraline (ZOLOFT) 25 MG tablet Take 12.5 mg by mouth at bedtime.   Yes [provider]  Skin Protectants, Misc. (MINERIN CREME EX) Apply 1 application topically See admin instructions. To feet at bedtime   Yes [provider]  traMADol (ULTRAM) 50 MG tablet Take 25 mg by mouth every 6 (six) hours as needed for moderate pain.   Yes [provider]  traZODone (DESYREL) 50 MG tablet Take 50 mg by mouth at bedtime.   Yes [provider]  vitamin B-12 (CYANOCOBALAMIN) 1000 MCG tablet Take 1,000 mcg by mouth daily.   Yes [provider]  Zinc Oxide (DESITIN MAXIMUM STRENGTH EX) Apply 1 application topically See admin instructions. To groin area bid prn redness   Yes [provider]  divalproex (DEPAKOTE) 125 MG DR tablet Take 125 mg by mouth 2 (two) times daily. Patient not taking: Reported on 07/24/2021    [provider]  traZODone (DESYREL) 100 MG tablet Take 100 mg by mouth at bedtime as needed for sleep. Patient not taking:  Reported on 12/21/2020    [provider]    Allergies    Patient has no known allergies.  Review of Systems   Review of Systems  Unable to perform ROS: Dementia   Physical Exam Updated Vital Signs BP (!) 153/73   Pulse 87   Temp 97.8 F (36.6 C) (Oral)   Resp (!) 24   SpO2 94%   Physical Exam Vitals and nursing note reviewed.  Constitutional:      General: He is not in acute distress.    Appearance: He is well-developed. He is not diaphoretic.  HENT:     Head: Normocephalic and atraumatic.     Mouth/Throat:     Mouth: Mucous membranes are moist.  Eyes:     Extraocular Movements: Extraocular movements intact.     Pupils: Pupils are equal, round, and reactive to light.  Neck:     Comments: C-collar in place Cardiovascular:     Rate and Rhythm: Normal rate. Rhythm irregular.     Pulses: Normal pulses.     Heart sounds: Normal heart sounds.  Pulmonary:     Effort: Pulmonary effort is normal.     Breath sounds: Normal breath sounds.  Abdominal:     Palpations: Abdomen is soft.     Tenderness: There is no abdominal tenderness.  Musculoskeletal:     Comments: No pain with ROM hips, pelvis stable and non tender. Right shoulder immobilizer in place, ecchymosis to right upper arm from known proximal humerus fracture. Sensation intact right hand, radial pulse present.   Skin:    General: Skin is warm and dry.     Findings: Bruising present.  Neurological:     General: No focal deficit present.     Mental Status: He is alert.  Psychiatric:        Behavior: Behavior normal.    ED Results / Procedures / Treatments   Labs (all labs ordered are listed, but only abnormal results are displayed) Labs Reviewed - No data to display  EKG None  Radiology DG Shoulder Right  Result Date: 07/24/2021 CLINICAL DATA:  Shoulder pain.  Fall. EXAM: RIGHT SHOULDER - 2+ VIEW COMPARISON:  None. FINDINGS: Three views study again shows a comminuted fracture involving the right  humeral head and neck. Appearance is similar without substantial interval migration of fracture fragments. Nonacute distal right clavicle fracture again noted. Bones are diffusely demineralized. IMPRESSION: 1. Comminuted fracture of the right humeral head and neck without substantial interval migration of fracture fragments. 2. Chronic distal right clavicle fracture. Electronically Signed   By: Kennith Center M.D.   On: 07/24/2021 07:39   CT Head Wo Contrast  Result Date: 07/24/2021 CLINICAL DATA:  87 year old male status post unwitnessed fall. Skin tear. EXAM: CT HEAD WITHOUT CONTRAST TECHNIQUE: Contiguous axial images were obtained from the base of the  skull through the vertex without intravenous contrast. COMPARISON:  Head CT 10/03/2020. FINDINGS: Brain: Scattered chronic encephalomalacia in the right frontal, right parietal, and left temporal lobes appears related to chronic cortical infarcts. Stable cerebral volume. Patchy and confluent superimposed bilateral white matter hypodensity. Chronic right cerebellar infarct is stable. No midline shift, ventriculomegaly, mass effect, evidence of mass lesion, intracranial hemorrhage or evidence of cortically based acute infarction. Vascular: Mild Calcified atherosclerosis at the skull base. No suspicious intracranial vascular hyperdensity. Skull: No acute osseous abnormality identified. Sinuses/Orbits: Visualized paranasal sinuses and mastoids are stable and well aerated. Other: No orbit or scalp soft tissue injury identified. IMPRESSION: 1. No acute intracranial abnormality or acute traumatic injury identified. 2. Stable non contrast CT appearance of advanced chronic ischemic disease. Electronically Signed   By: Odessa Fleming M.D.   On: 07/24/2021 07:43   CT Cervical Spine Wo Contrast  Result Date: 07/24/2021 CLINICAL DATA:  87 year old male status post unwitnessed fall. Skin tear. EXAM: CT CERVICAL SPINE WITHOUT CONTRAST TECHNIQUE: Multidetector CT imaging of the  cervical spine was performed without intravenous contrast. Multiplanar CT image reconstructions were also generated. COMPARISON:  Head CT today.  Cervical spine CT 10/03/2020. FINDINGS: Alignment: Stable cervical lordosis since February including mild degenerative appearing anterolisthesis of C7 on T1. Bilateral posterior element alignment is within normal limits. Skull base and vertebrae: Visualized skull base is intact. No atlanto-occipital dissociation. C1 and C2 appear intact and aligned. Chronic degenerative subchondral cyst at the left base of the odontoid. No acute osseous abnormality identified. Soft tissues and spinal canal: No prevertebral fluid or swelling. No visible canal hematoma. Partially retropharyngeal course of the right carotid. Negative visible noncontrast neck soft tissues. Disc levels: Cervical spine degeneration appears stable since February with up to mild associated spinal stenosis. Upper chest: Bulky anterior endplate osteophyte at T2-T3 without ankylosis, but flowing endplate osteophytes from T3 through at least T6 with evidence of chronic interbody ankylosis. Negative lung apices. Calcified aortic atherosclerosis. IMPRESSION: 1. No acute traumatic injury identified in the cervical spine. 2. Upper thoracic idiopathic skeletal hyperostosis (DISH) with evidence of ankylosis from T3 through at least T6. 3. Aortic Atherosclerosis (ICD10-I70.0). Electronically Signed   By: Odessa Fleming M.D.   On: 07/24/2021 07:48    Procedures Procedures   Medications Ordered in ED Medications  LORazepam (ATIVAN) injection 1 mg (1 mg Intramuscular Given 07/24/21 0818)    ED Course  I have reviewed the triage vital signs and the nursing notes.  Pertinent labs & imaging results that were available during my care of the patient were reviewed by me and considered in my medical decision making (see chart for details).  Clinical Course as of 07/24/21 1524  Tue Jul 24, 2021  [id number] 87 year old male brought  in by EMS from her care facility for evaluation after a fall as above.  Patient is reported to be at his baseline ental status.  He follows simple commands, denies any complaints of pain however when I mention his previous right arm fracture which is in a shoulder immobilizer, he is unaware that his right arm is broken.  He is not anticoagulated. He is found to have ecchymosis of the right upper arm likely secondary to his fall 3 days ago and right proximal humerus fracture.  His pelvis is stable and nontender.  I am able to range his left upper extremity as well as bilateral lower extremities without pain or limitation.  No midline neck or back tenderness, c-collar in place.  CT head  and C-spine obtained due to fall in a demented patient and are negative for acute injury.  X-ray of the right shoulder shows a stable fracture. Patient became agitated, is yelling and cursing at staff and trying to hit staff.  CBG is canceled.  Plan is to give patient breakfast and discharged back to facility. Ativan ordered if patient remains agitated and a danger to self and staff. [LM]    Clinical Course User Index [LM] Alden Hipp   MDM Rules/Calculators/A&P                           Final Clinical Impression(s) / ED Diagnoses Final diagnoses:  Fall, initial encounter  Closed fracture of proximal end of right humerus with routine healing, unspecified fracture morphology, subsequent encounter    Rx / DC Orders ED Discharge Orders     None        Jeannie Fend, PA-C 07/24/21 1525    Margarita Grizzle, MD 07/25/21 1025

## 2021-07-24 NOTE — ED Notes (Signed)
Pt returned from CT °

## 2021-07-24 NOTE — Discharge Instructions (Signed)
Follow up with orthopedics as previously directed for humerus fracture which is unchanged today.

## 2021-07-24 NOTE — ED Notes (Signed)
Patient transported to X-ray 

## 2021-08-17 ENCOUNTER — Other Ambulatory Visit: Payer: Self-pay

## 2021-08-17 ENCOUNTER — Encounter (HOSPITAL_COMMUNITY): Payer: Self-pay

## 2021-08-17 ENCOUNTER — Emergency Department (HOSPITAL_COMMUNITY)
Admission: EM | Admit: 2021-08-17 | Discharge: 2021-08-18 | Disposition: A | Discharge status: Skilled Nursing Facility | Payer: Medicare Other | Attending: Emergency Medicine | Admitting: Emergency Medicine

## 2021-08-17 ENCOUNTER — Emergency Department (HOSPITAL_COMMUNITY): Payer: Medicare Other

## 2021-08-17 DIAGNOSIS — S0990XA Unspecified injury of head, initial encounter: Secondary | ICD-10-CM | POA: Diagnosis not present

## 2021-08-17 DIAGNOSIS — W07XXXA Fall from chair, initial encounter: Secondary | ICD-10-CM | POA: Insufficient documentation

## 2021-08-17 DIAGNOSIS — E1140 Type 2 diabetes mellitus with diabetic neuropathy, unspecified: Secondary | ICD-10-CM | POA: Diagnosis not present

## 2021-08-17 DIAGNOSIS — F0390 Unspecified dementia without behavioral disturbance: Secondary | ICD-10-CM | POA: Diagnosis not present

## 2021-08-17 DIAGNOSIS — I2510 Atherosclerotic heart disease of native coronary artery without angina pectoris: Secondary | ICD-10-CM | POA: Insufficient documentation

## 2021-08-17 NOTE — ED Notes (Signed)
Pt resting in stretcher, call light in reach, no distress noted. RN ordered dinner tray as [facility] transport is very behind at this time. Previous RN called report to facility prior to this RN assuming care.

## 2021-08-17 NOTE — Discharge Instructions (Signed)
No findings on toda's images.

## 2021-08-17 NOTE — ED Notes (Signed)
Report called to [employer] RN

## 2021-08-17 NOTE — ED Provider Notes (Signed)
[facility] EMERGENCY DEPARTMENT Provider Note   CSN: [id number] Arrival date & time: 08/17/21  1200     History Chief Complaint  Patient presents with   Kevin [name]    Kevin [name] is a 87 y.o. male with a past medical history of dementia who was sent in from his skilled nursing facility for fall.  Patient had a witnessed fall from his wheelchair.  He fell forward and hit his head on a door frame.  Patient is at his baseline orientation and did not exhibit any confusion, vomiting and has no visible injuries.  There is a level 5 caveat due to dementia.  Patient is unable to contribute to the history   Fall      Past Medical History:  Diagnosis Date   BPH (benign prostatic hyperplasia)    Coronary artery disease    Dementia (HCC)    Diabetes mellitus without complication (HCC)    Dizziness    Dyspnea    GERD (gastroesophageal reflux disease)    Gout    Hypotension    Neuropathy    Thyroid disease     Patient Active Problem List   Diagnosis Date Noted   Dementia with behavioral disturbance 06/15/2019    History reviewed. No pertinent surgical history.     History reviewed. No pertinent family history.  Social History   Tobacco Use   Smoking status: Unknown   Smokeless tobacco: Never  Vaping Use   Vaping Use: Never used  Substance Use Topics   Alcohol use: Not Currently   Drug use: Not Currently    Home Medications Prior to Admission medications   Medication Sig Start Date End Date Taking? Authorizing Provider  acetaminophen (TYLENOL) 325 MG tablet Take 650 mg by mouth daily as needed for mild pain.    [provider]  allopurinol (ZYLOPRIM) 100 MG tablet Take 100 mg by mouth daily. 01/25/19   [provider]  Cholecalciferol (VITAMIN D3) 10 MCG (400 UNIT) CAPS Take 400 Units by mouth daily.    [provider]  divalproex (DEPAKOTE) 125 MG DR tablet Take 125 mg by mouth 2 (two) times daily. Patient not taking:  Reported on 07/24/2021    [provider]  famotidine (PEPCID) 20 MG tablet Take 20 mg by mouth daily.    [provider]  fluconazole (DIFLUCAN) 150 MG tablet Take 150 mg by mouth every Friday.    [provider]  melatonin 3 MG TABS tablet Take 3 mg by mouth at bedtime.    [provider]  memantine (NAMENDA) 10 MG tablet Take 10 mg by mouth 2 (two) times daily. 03/25/19   [provider]  Nutritional Supplements (BOOST PO) Take 1 Container by mouth daily at 2 PM. Chocolate-    [provider]  OLANZapine (ZYPREXA) 7.5 MG tablet Take 7.5 mg by mouth at bedtime. 07/04/21   [provider]  ondansetron (ZOFRAN ODT) 4 MG disintegrating tablet Take 1 tablet (4 mg total) by mouth every 8 (eight) hours as needed for nausea. 06/15/21   Eber Hong, MD  polyethylene glycol powder (GLYCOLAX/MIRALAX) 17 GM/SCOOP powder Take 17 g by mouth every other day.    [provider]  sertraline (ZOLOFT) 25 MG tablet Take 12.5 mg by mouth at bedtime.    [provider]  Skin Protectants, Misc. (MINERIN CREME EX) Apply 1 application topically See admin instructions. To feet at bedtime    [provider]  traMADol (ULTRAM) 50 MG  tablet Take 25 mg by mouth every 6 (six) hours as needed for moderate pain.    [provider]  traZODone (DESYREL) 100 MG tablet Take 100 mg by mouth at bedtime as needed for sleep. Patient not taking: Reported on 12/21/2020    [provider]  traZODone (DESYREL) 50 MG tablet Take 50 mg by mouth at bedtime.    [provider]  vitamin B-12 (CYANOCOBALAMIN) 1000 MCG tablet Take 1,000 mcg by mouth daily.    [provider]  Zinc Oxide (DESITIN MAXIMUM STRENGTH EX) Apply 1 application topically See admin instructions. To groin area bid prn redness    [provider]    Allergies    Patient has no known allergies.  Review of Systems   Review of Systems Ten  systems reviewed and are negative for acute change, except as noted in the HPI.   Physical Exam Updated Vital Signs BP 117/81 (BP Location: Right Arm)    Pulse 83    Temp 97.6 F (36.4 C) (Oral)    Resp 20    Ht 6\' 1"  (1.854 m)    Wt 95.3 kg    SpO2 94%    BMI 27.72 kg/m   Physical Exam Vitals and nursing note reviewed.  Constitutional:      General: He is not in acute distress.    Appearance: He is well-developed. He is not diaphoretic.  HENT:     Head: Normocephalic and atraumatic.  Eyes:     General: No scleral icterus.    Conjunctiva/sclera: Conjunctivae normal.  Cardiovascular:     Rate and Rhythm: Normal rate and regular rhythm.     Heart sounds: Normal heart sounds.  Pulmonary:     Effort: Pulmonary effort is normal. No respiratory distress.     Breath sounds: Normal breath sounds.  Abdominal:     Palpations: Abdomen is soft.     Tenderness: There is no abdominal tenderness.  Musculoskeletal:     Cervical back: Normal range of motion and neck supple.  Skin:    General: Skin is warm and dry.  Neurological:     Mental Status: He is alert. He is disoriented.  Psychiatric:        Behavior: Behavior normal.    ED Results / Procedures / Treatments   Labs (all labs ordered are listed, but only abnormal results are displayed) Labs Reviewed - No data to display  EKG None  Radiology No results found.  Procedures Procedures   Medications Ordered in ED Medications - No data to display  ED Course  I have reviewed the triage vital signs and the nursing notes.  Pertinent labs & imaging results that were available during my care of the patient were reviewed by me and considered in my medical decision making (see chart for details).    MDM Rules/Calculators/A&P                         87 year old male with mechanical fall that was witnessed, no obvious traumatic injuries on physical examination.  I ordered and reviewed CT scan of the head face and C-spine.  All of  these are negative for acute abnormalities.  Patient is at his baseline mental status.  He appears appropriate for discharge at this time back to skilled nursing facility Final Clinical Impression(s) / ED Diagnoses Final diagnoses:  None    Rx / DC Orders ED Discharge Orders     None  Arthor Captain, PA-C 08/17/21 1625    Ernie Avena, MD 08/17/21 [phone]

## 2021-08-17 NOTE — ED Triage Notes (Signed)
Pt BIB EMS from [facility] after a witnessed fall from his wheelchair. Pt hit his head on door frame, pt has no obvious injuries, denies LOC, and is not on blood thinners. Pt baseline is alert and oriented to self only. Pt remains at baseline

## 2021-12-10 ENCOUNTER — Emergency Department (HOSPITAL_COMMUNITY)
Admission: EM | Admit: 2021-12-10 | Discharge: 2021-12-10 | Disposition: A | Discharge status: Home / Self Care | Payer: Medicare Other | Attending: Emergency Medicine | Admitting: Emergency Medicine

## 2021-12-10 ENCOUNTER — Emergency Department (HOSPITAL_COMMUNITY): Payer: Medicare Other

## 2021-12-10 DIAGNOSIS — Z043 Encounter for examination and observation following other accident: Secondary | ICD-10-CM | POA: Insufficient documentation

## 2021-12-10 DIAGNOSIS — W19XXXA Unspecified fall, initial encounter: Secondary | ICD-10-CM

## 2021-12-10 DIAGNOSIS — Y92129 Unspecified place in nursing home as the place of occurrence of the external cause: Secondary | ICD-10-CM | POA: Insufficient documentation

## 2021-12-10 DIAGNOSIS — F0390 Unspecified dementia without behavioral disturbance: Secondary | ICD-10-CM | POA: Insufficient documentation

## 2021-12-10 DIAGNOSIS — W1839XA Other fall on same level, initial encounter: Secondary | ICD-10-CM | POA: Diagnosis not present

## 2021-12-10 DIAGNOSIS — E119 Type 2 diabetes mellitus without complications: Secondary | ICD-10-CM | POA: Diagnosis not present

## 2021-12-10 NOTE — ED Notes (Signed)
Patient transported to CT 

## 2021-12-10 NOTE — ED Triage Notes (Signed)
Pt BIB [facility] from [facility] memory care unit after an unwitnessed fall from sitting. No thinners, no injuries. Pt in NAD. Pt in converted from abnormal rhythm to sinus and back 8 times en route. No hx of afib. Palpated HR of 37 ?

## 2021-12-10 NOTE — ED Provider Notes (Signed)
?[facility] EMERGENCY DEPARTMENT ?Provider Note ? ? ?CSN: [id number] ?Arrival date & time: 12/10/21  1227 ? ?  ? ?History ? ?Chief Complaint  ?Patient presents with  ? Fall  ? ? ?Kevin [name] is a 88 y.o. male. ? ?88 year old male brought in by EMS from memory care facility for evaluation after a fall. Patient does not recall falling and denies injuries. Per EMS report from facility who reported patient was ambulatory in the common area at baseline, then found on the floor, suspected fall picking up jelly beans off the floor.  ? ? ?  ? ?Home Medications ?Prior to Admission medications   ?Medication Sig Start Date End Date Taking? Authorizing Provider  ?acetaminophen (TYLENOL) 500 MG tablet Take 1,000 mg by mouth every 8 (eight) hours as needed for mild pain.   Yes [provider]  ?allopurinol (ZYLOPRIM) 100 MG tablet Take 100 mg by mouth daily. 01/25/19  Yes [provider]  ?celecoxib (CELEBREX) 100 MG capsule Take 100 mg by mouth 2 (two) times daily.   Yes [provider]  ?Cholecalciferol (VITAMIN D3) 10 MCG (400 UNIT) CAPS Take 400 Units by mouth daily.   Yes [provider]  ?famotidine (PEPCID) 20 MG tablet Take 20 mg by mouth daily.   Yes [provider]  ?fluconazole (DIFLUCAN) 150 MG tablet Take 150 mg by mouth every Friday.   Yes [provider]  ?fluticasone (FLONASE) 50 MCG/ACT nasal spray Place 1 spray into both nostrils daily.   Yes [provider]  ?melatonin 3 MG TABS tablet Take 3 mg by mouth at bedtime.   Yes [provider]  ?memantine (NAMENDA) 5 MG tablet Take 5 mg by mouth 2 (two) times daily. 11/21/21  Yes [provider]  ?Nutritional Supplements (BOOST PO) Take 1 Container by mouth daily at 2 PM.   Yes [provider]  ?OLANZapine (ZYPREXA) 7.5 MG tablet Take 7.5 mg by mouth daily. At 14:00 07/04/21  Yes [provider]  ?ondansetron (ZOFRAN ODT) 4 MG disintegrating tablet  Take 1 tablet (4 mg total) by mouth every 8 (eight) hours as needed for nausea. 06/15/21  Yes Eber Hong, MD  ?polyethylene glycol powder (GLYCOLAX/MIRALAX) 17 GM/SCOOP powder Take 17 g by mouth every other day.   Yes [provider]  ?PRESCRIPTION MEDICATION Apply 2 mg topically 2 (two) times daily as needed (anxiety, agitation). Lorazepam 2mg /ml plo gel. 2mg  to wrist twice daily as needed if trazodone tablets are refused.   Yes [provider]  ?sertraline (ZOLOFT) 25 MG tablet Take 12.5 mg by mouth at bedtime.   Yes [provider]  ?Skin Protectants, Misc. (MINERIN CREME EX) Apply 1 application topically See admin instructions. To feet at bedtime   Yes [provider]  ?traZODone (DESYREL) 50 MG tablet Take 50 mg by mouth at bedtime.   Yes [provider]  ?vitamin B-12 (CYANOCOBALAMIN) 1000 MCG tablet Take 1,000 mcg by mouth daily.   Yes [provider]  ?Zinc Oxide (DESITIN MAXIMUM STRENGTH EX) Apply 1 application. topically See admin instructions. To groin area twice daily as needed for redness   Yes [provider]  ?   ? ?Allergies    ?Patient has no known allergies.   ? ?Review of Systems   ?Review of Systems  ?Unable to perform ROS: Dementia  ?Level 5 caveat for dementia  ?Physical Exam ?Updated Vital Signs ?BP 130/66 (BP Location: Right Arm)   Pulse 88  Temp 97.7 ?F (36.5 ?C) (Oral)   Resp 18   SpO2 97%  ?Physical Exam ?Vitals and nursing note reviewed.  ?Constitutional:   ?   General: He is not in acute distress. ?   Appearance: He is well-developed. He is not diaphoretic.  ?HENT:  ?   Head: Normocephalic and atraumatic.  ?   Nose: Nose normal.  ?   Mouth/Throat:  ?   Mouth: Mucous membranes are moist.  ?Eyes:  ?   Extraocular Movements: Extraocular movements intact.  ?   Pupils: Pupils are equal, round, and reactive to light.  ?Cardiovascular:  ?   Rate and Rhythm: Normal rate. Rhythm irregular.  ?   Pulses: Normal pulses.  ?    Heart sounds: Normal heart sounds.  ?Pulmonary:  ?   Effort: Pulmonary effort is normal.  ?   Breath sounds: Normal breath sounds.  ?Abdominal:  ?   Palpations: Abdomen is soft.  ?   Tenderness: There is no abdominal tenderness.  ?Musculoskeletal:     ?   General: No swelling, tenderness, deformity or signs of injury. Normal range of motion.  ?   Cervical back: Normal range of motion and neck supple. No tenderness or bony tenderness.  ?   Thoracic back: No tenderness or bony tenderness.  ?   Lumbar back: No tenderness or bony tenderness.  ?Skin: ?   General: Skin is warm and dry.  ?   Findings: No bruising, erythema or rash.  ?Neurological:  ?   General: No focal deficit present.  ?   Mental Status: He is alert. Mental status is at baseline.  ?Psychiatric:     ?   Behavior: Behavior normal.  ? ? ?ED Results / Procedures / Treatments   ?Labs ?(all labs ordered are listed, but only abnormal results are displayed) ?Labs Reviewed - No data to display ? ?EKG ?None ? ?Radiology ?CT Head Wo Contrast ? ?Result Date: 12/10/2021 ?CLINICAL DATA:  Trauma, fall EXAM: CT HEAD WITHOUT CONTRAST TECHNIQUE: Contiguous axial images were obtained from the base of the skull through the vertex without intravenous contrast. RADIATION DOSE REDUCTION: This exam was performed according to the departmental dose-optimization program which includes automated exposure control, adjustment of the mA and/or kV according to patient size and/or use of iterative reconstruction technique. COMPARISON:  08/17/2021 FINDINGS: Brain: No acute intracranial findings are seen. There are foci of encephalomalacia in the right frontal right parietal and left temporal lobes. There is small focus of old infarct in the posterior right cerebellum. Cortical sulci are prominent. There is decreased density in the periventricular and subcortical white matter. Vascular: Unremarkable. Skull: Unremarkable. Sinuses/Orbits: There is mild mucosal thickening in the ethmoid  sinus. Other: None IMPRESSION: No acute intracranial findings are seen. 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 Atrophy. Small-vessel disease. Chronic ethmoid sinusitis. Electronically Signed   By: Ernie Avena M.D.   On: 12/10/2021 13:40  ? ?CT Cervical Spine Wo Contrast ? ?Result Date: 12/10/2021 ?CLINICAL DATA:  Trauma, pain EXAM: CT CERVICAL SPINE WITHOUT CONTRAST TECHNIQUE: Multidetector CT imaging of the cervical spine was performed without intravenous contrast. Multiplanar CT image reconstructions were also generated. RADIATION DOSE REDUCTION: This exam was performed according to the departmental dose-optimization program which includes automated exposure control, adjustment of the mA and/or kV according to patient size and/or use of iterative reconstruction technique. COMPARISON:  08/17/2021 FINDINGS: Alignment: There is minimal anterolisthesis at C7-T1 level. This may  be due to previous ligament injury. This finding has not changed. Skull base and vertebrae: No recent fracture is seen. Degenerative changes are noted with bony spurs and subcortical cysts in the atlantoaxial joint. Degenerative changes are also noted with bony spurs at multiple levels in the cervical spine. Soft tissues and spinal canal: There is extrinsic pressure over the ventral margin of thecal sac caused by bony spurs, more so at C5-C6 and C6-C7 levels. Disc levels: There is encroachment of neural foramina by bony spurs and facet hypertrophy from C3 to C7 levels. Upper chest: Unremarkable. Other: None IMPRESSION: No recent fracture is seen. Cervical spondylosis with encroachment of neural foramina at multiple levels. Electronically Signed   By: Ernie AvenaPalani  Rathinasamy M.D.   On: 12/10/2021 13:49   ? ?Procedures ?Procedures  ? ? ?Medications Ordered in ED ?Medications - No data to display ? ?ED Course/ Medical Decision Making/ A&P ?  ?                        ?Medical  Decision Making ? ?This patient presents to the ED for concern of fall, not on thinners, this involves an extensive number of treatment options, and is a complaint that carries with it a high risk of complications

## 2021-12-10 NOTE — ED Notes (Signed)
D/C to [facility] in [place] with their staff. ?

## 2021-12-10 NOTE — Discharge Instructions (Signed)
CT head and neck are normal, no obvious injury. Recheck for any worsening or concerning symptoms.  ?

## 2022-02-13 IMAGING — DX DG CHEST 1V PORT
1 series · 1 of 1 positions shown · non-contrast
Comparison: 06/15/2019

CLINICAL DATA: Chest pain

EXAM:
PORTABLE CHEST 1 VIEW

[chest]
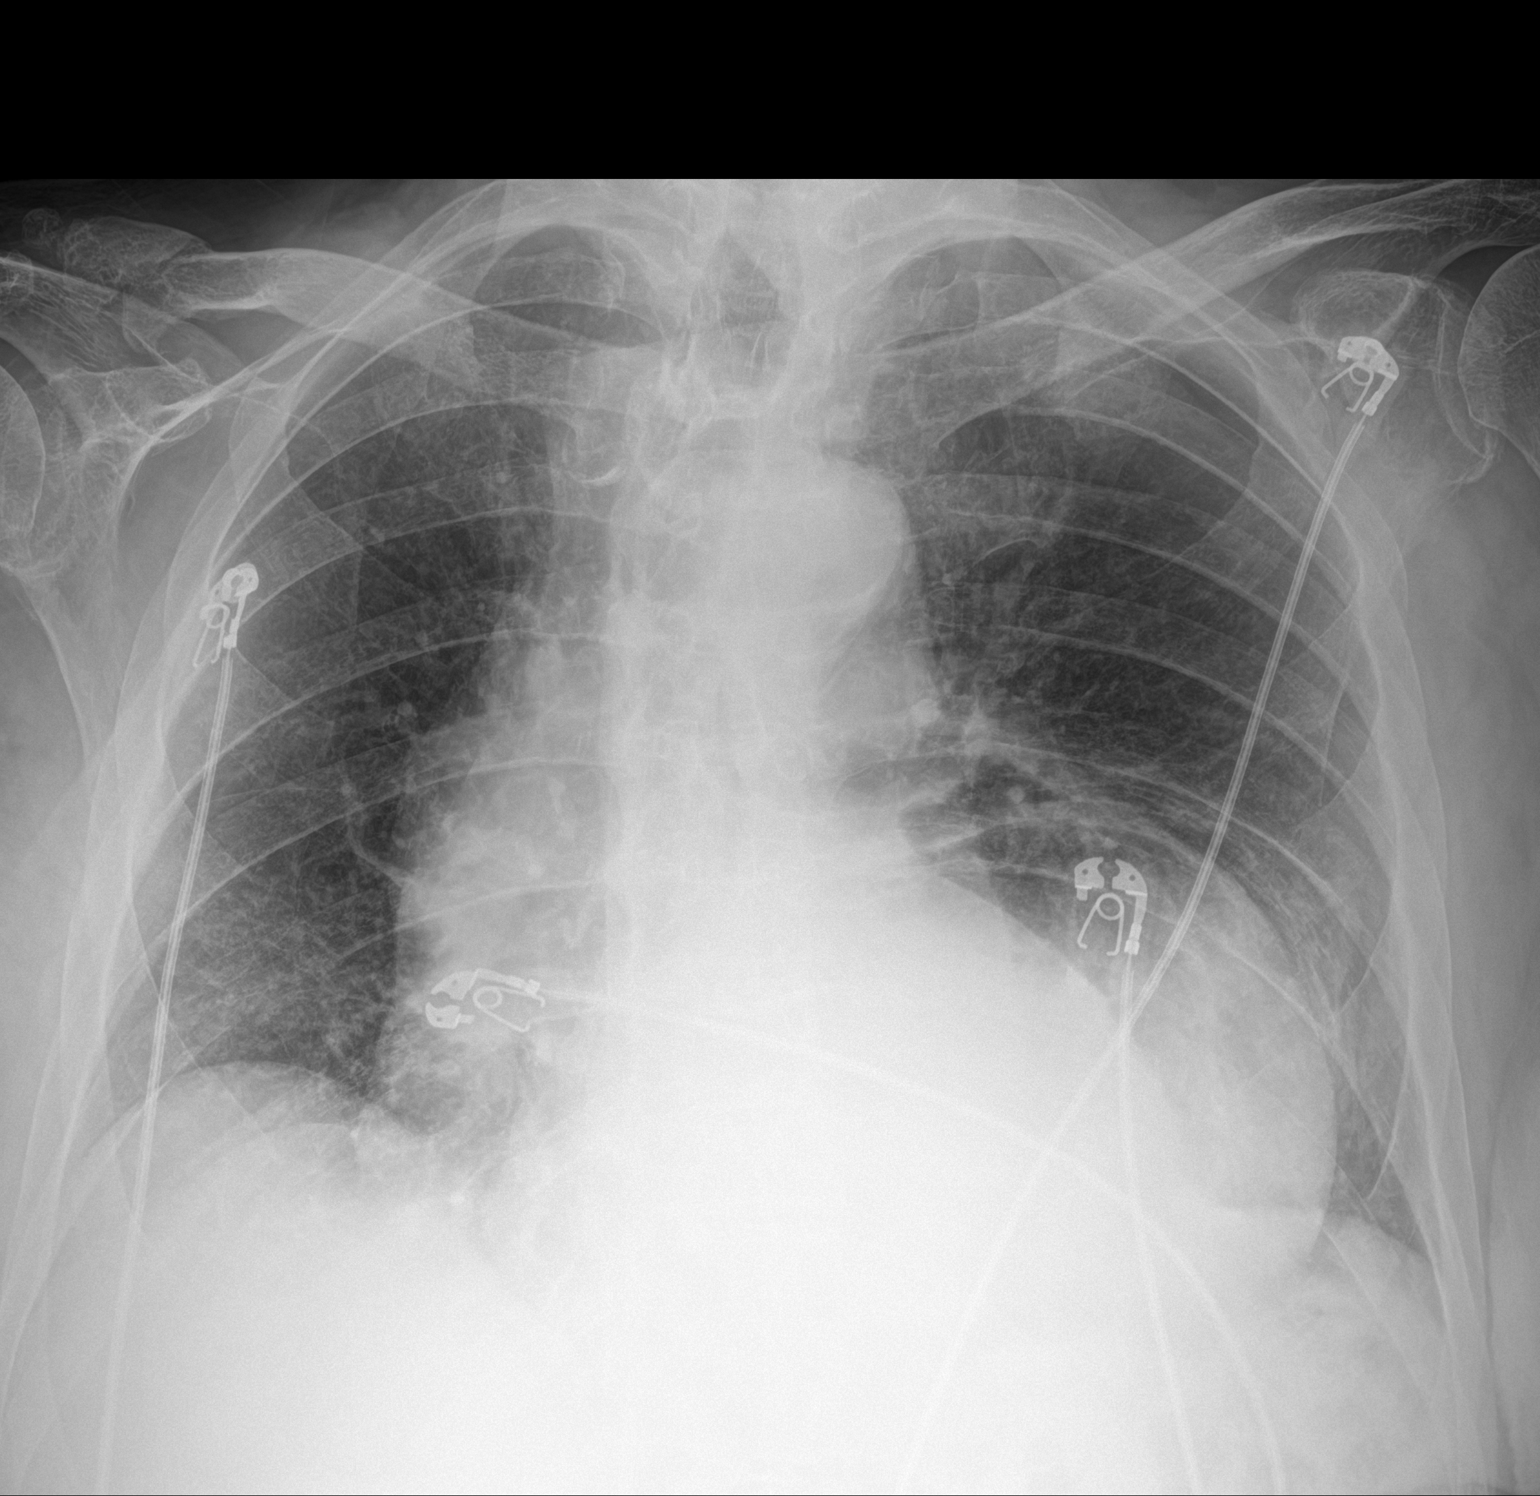

[1 of 1 positions shown; findings below may reference images not displayed]

FINDINGS: Large hiatal hernia is again seen with increasing gaseous distension
of the intrathoracic stomach. Mild left basilar atelectasis. Lungs
are otherwise clear. No pneumothorax or pleural effusion. Mild
cardiomegaly is stable. Posttraumatic changes are again noted within
the distal right clavicle. No acute bone abnormality.
IMPRESSION: Large hiatal hernia with mildly increased gaseous distension of the
intrathoracic stomach.

## 2023-01-25 DEATH — deceased
